# Patient Record
Sex: Male | Born: 1945 | Race: White | Hispanic: No | Marital: Married | Smoking: Never smoker
Health system: Southern US, Community
[De-identification: ages and names within clinical notes are randomized; demographics above are authoritative.]

## PROBLEM LIST (undated history)

## (undated) DIAGNOSIS — E785 Hyperlipidemia, unspecified: Secondary | ICD-10-CM

## (undated) DIAGNOSIS — K219 Gastro-esophageal reflux disease without esophagitis: Secondary | ICD-10-CM

## (undated) DIAGNOSIS — D126 Benign neoplasm of colon, unspecified: Secondary | ICD-10-CM

## (undated) DIAGNOSIS — I639 Cerebral infarction, unspecified: Secondary | ICD-10-CM

## (undated) DIAGNOSIS — H269 Unspecified cataract: Secondary | ICD-10-CM

## (undated) DIAGNOSIS — D649 Anemia, unspecified: Secondary | ICD-10-CM

## (undated) DIAGNOSIS — I82402 Acute embolism and thrombosis of unspecified deep veins of left lower extremity: Secondary | ICD-10-CM

## (undated) DIAGNOSIS — M4800 Spinal stenosis, site unspecified: Secondary | ICD-10-CM

## (undated) DIAGNOSIS — N189 Chronic kidney disease, unspecified: Secondary | ICD-10-CM

## (undated) DIAGNOSIS — I10 Essential (primary) hypertension: Secondary | ICD-10-CM

## (undated) DIAGNOSIS — D689 Coagulation defect, unspecified: Secondary | ICD-10-CM

## (undated) DIAGNOSIS — M1990 Unspecified osteoarthritis, unspecified site: Secondary | ICD-10-CM

## (undated) DIAGNOSIS — C439 Malignant melanoma of skin, unspecified: Secondary | ICD-10-CM

## (undated) DIAGNOSIS — M109 Gout, unspecified: Secondary | ICD-10-CM

## (undated) DIAGNOSIS — K259 Gastric ulcer, unspecified as acute or chronic, without hemorrhage or perforation: Secondary | ICD-10-CM

## (undated) DIAGNOSIS — T7840XA Allergy, unspecified, initial encounter: Secondary | ICD-10-CM

## (undated) DIAGNOSIS — G4730 Sleep apnea, unspecified: Secondary | ICD-10-CM

## (undated) HISTORY — DX: Coagulation defect, unspecified: D68.9

## (undated) HISTORY — DX: Essential (primary) hypertension: I10

## (undated) HISTORY — PX: POLYPECTOMY: SHX149

## (undated) HISTORY — DX: Unspecified cataract: H26.9

## (undated) HISTORY — DX: Hyperlipidemia, unspecified: E78.5

## (undated) HISTORY — PX: UPPER GASTROINTESTINAL ENDOSCOPY: SHX188

## (undated) HISTORY — DX: Chronic kidney disease, unspecified: N18.9

## (undated) HISTORY — DX: Allergy, unspecified, initial encounter: T78.40XA

## (undated) HISTORY — PX: COLONOSCOPY: SHX174

## (undated) HISTORY — DX: Malignant melanoma of skin, unspecified: C43.9

## (undated) HISTORY — DX: Cerebral infarction, unspecified: I63.9

## (undated) HISTORY — PX: SHOULDER ARTHROSCOPY W/ ROTATOR CUFF REPAIR: SHX2400

## (undated) HISTORY — DX: Gastro-esophageal reflux disease without esophagitis: K21.9

## (undated) HISTORY — DX: Sleep apnea, unspecified: G47.30

## (undated) HISTORY — DX: Benign neoplasm of colon, unspecified: D12.6

## (undated) HISTORY — DX: Acute embolism and thrombosis of unspecified deep veins of left lower extremity: I82.402

## (undated) HISTORY — DX: Unspecified osteoarthritis, unspecified site: M19.90

## (undated) HISTORY — DX: Spinal stenosis, site unspecified: M48.00

## (undated) HISTORY — DX: Gout, unspecified: M10.9

## (undated) HISTORY — DX: Gastric ulcer, unspecified as acute or chronic, without hemorrhage or perforation: K25.9

## (undated) HISTORY — DX: Anemia, unspecified: D64.9

---

## 2003-10-21 HISTORY — PX: BACK SURGERY: SHX140

## 2003-10-21 HISTORY — PX: TOTAL KNEE ARTHROPLASTY: SHX125

## 2004-07-20 ENCOUNTER — Encounter: Payer: Self-pay | Admitting: Orthopedic Surgery

## 2004-10-20 HISTORY — PX: TOTAL KNEE ARTHROPLASTY: SHX125

## 2005-08-12 ENCOUNTER — Ambulatory Visit: Payer: Self-pay | Admitting: Internal Medicine

## 2008-05-29 ENCOUNTER — Ambulatory Visit: Payer: Self-pay | Admitting: Internal Medicine

## 2008-06-07 ENCOUNTER — Ambulatory Visit: Payer: Self-pay | Admitting: Internal Medicine

## 2008-06-07 ENCOUNTER — Encounter: Payer: Self-pay | Admitting: Internal Medicine

## 2008-06-09 ENCOUNTER — Encounter: Payer: Self-pay | Admitting: Internal Medicine

## 2012-10-20 DIAGNOSIS — C439 Malignant melanoma of skin, unspecified: Secondary | ICD-10-CM

## 2012-10-20 HISTORY — PX: OTHER SURGICAL HISTORY: SHX169

## 2012-10-20 HISTORY — PX: GASTRIC BYPASS: SHX52

## 2012-10-20 HISTORY — DX: Malignant melanoma of skin, unspecified: C43.9

## 2013-02-15 DIAGNOSIS — I10 Essential (primary) hypertension: Secondary | ICD-10-CM | POA: Insufficient documentation

## 2013-02-15 DIAGNOSIS — M1990 Unspecified osteoarthritis, unspecified site: Secondary | ICD-10-CM | POA: Insufficient documentation

## 2013-02-15 DIAGNOSIS — E782 Mixed hyperlipidemia: Secondary | ICD-10-CM | POA: Insufficient documentation

## 2013-02-15 DIAGNOSIS — G4730 Sleep apnea, unspecified: Secondary | ICD-10-CM | POA: Insufficient documentation

## 2013-06-14 ENCOUNTER — Encounter: Payer: Self-pay | Admitting: Internal Medicine

## 2013-07-20 HISTORY — PX: ROUX-EN-Y GASTRIC BYPASS: SHX1104

## 2013-11-04 DIAGNOSIS — H02409 Unspecified ptosis of unspecified eyelid: Secondary | ICD-10-CM | POA: Insufficient documentation

## 2013-11-15 HISTORY — PX: OTHER SURGICAL HISTORY: SHX169

## 2014-02-17 DIAGNOSIS — Z85820 Personal history of malignant melanoma of skin: Secondary | ICD-10-CM | POA: Insufficient documentation

## 2014-02-27 HISTORY — PX: OTHER SURGICAL HISTORY: SHX169

## 2014-04-12 ENCOUNTER — Encounter: Payer: Self-pay | Admitting: Internal Medicine

## 2014-05-10 ENCOUNTER — Encounter: Payer: Self-pay | Admitting: Internal Medicine

## 2014-05-12 DIAGNOSIS — Z96659 Presence of unspecified artificial knee joint: Secondary | ICD-10-CM | POA: Insufficient documentation

## 2014-07-15 ENCOUNTER — Encounter: Payer: Self-pay | Admitting: Internal Medicine

## 2014-10-04 DIAGNOSIS — M1711 Unilateral primary osteoarthritis, right knee: Secondary | ICD-10-CM | POA: Insufficient documentation

## 2014-10-04 DIAGNOSIS — Z86718 Personal history of other venous thrombosis and embolism: Secondary | ICD-10-CM | POA: Insufficient documentation

## 2014-10-20 HISTORY — PX: TOTAL KNEE ARTHROPLASTY: SHX125

## 2014-10-31 HISTORY — PX: TOTAL KNEE ARTHROPLASTY: SHX125

## 2014-11-01 DIAGNOSIS — R001 Bradycardia, unspecified: Secondary | ICD-10-CM | POA: Insufficient documentation

## 2015-06-05 ENCOUNTER — Encounter: Payer: Self-pay | Admitting: Internal Medicine

## 2015-06-08 ENCOUNTER — Telehealth: Payer: Self-pay | Admitting: *Deleted

## 2015-06-08 ENCOUNTER — Ambulatory Visit (AMBULATORY_SURGERY_CENTER): Payer: Self-pay | Admitting: *Deleted

## 2015-06-08 VITALS — Ht 69.0 in | Wt 214.8 lb

## 2015-06-08 DIAGNOSIS — Z86010 Personal history of colonic polyps: Secondary | ICD-10-CM

## 2015-06-08 MED ORDER — NA SULFATE-K SULFATE-MG SULF 17.5-3.13-1.6 GM/177ML PO SOLN: ORAL | Status: DC

## 2015-06-08 NOTE — Telephone Encounter (Signed)
Dr Henrene Pastor:  Pt is scheduled for recall colonoscopy for hx colon polyps on 06/19/15.  Recall was due 2014.  Pt had bariatric surgery with Dr. Hampton Abbot  2014 for obesity and had EGD at that time showing:   A. GASTRIC ANTRUM, ENDOSCOPIC BIOPSY:  GASTRIC ANTRAL MUCOSA WITH MILD ACUTE INFLAMMATION, WITH FEATURES SUGGESTIVE OF EROSIVE GASTRITIS. NO HELICOBACTER PYLORI ORGANISMS ARE SEEN ON ROUTINE H&E STAIN. NO DYSPLASIA OR CARCINOMA IS SEEN.   B. GE JUNCTION, ENDOSCOPIC BIOPSY:  MUCOSA OF THE SQUAMOCOLUMNAR JUNCTION. GOBLET CELL INTESTINAL METAPLASIA IS SEEN IN GASTRIC TYPE MUCOSA, CONSISTENT WITH BARRETT'S ESOPHAGUS. NO DYSPLASIA IS SEEN.   ( Path report is under Care Everywhere)  Pt is scheduled to see Dr. Hampton Abbot in October of this year for follow up from bariatric surgery.  Is it ok for him to continue with scheduled colonoscopy and discuss need for EGD then or does he need OV with you?  Please advise.  Thanks, Juliann Pulse

## 2015-06-08 NOTE — Progress Notes (Signed)
No allergies to eggs or soy. No problems with anesthesia.  Pt given Emmi instructions for colonoscopy  No oxygen use  No diet drug use  

## 2015-06-08 NOTE — Telephone Encounter (Signed)
Note added to schedule as reminder to discuss with pt day of colonoscopy

## 2015-06-08 NOTE — Telephone Encounter (Signed)
You can schedule colonoscopy and we can discuss at that time. He will need to remind me, however. Thanks

## 2015-06-19 ENCOUNTER — Ambulatory Visit (AMBULATORY_SURGERY_CENTER): Payer: Medicare Other | Admitting: Internal Medicine

## 2015-06-19 ENCOUNTER — Encounter: Payer: Self-pay | Admitting: Internal Medicine

## 2015-06-19 VITALS — BP 126/64 | HR 55 | Temp 98.4°F | Resp 20 | Ht 69.0 in | Wt 214.0 lb

## 2015-06-19 DIAGNOSIS — Z86010 Personal history of colonic polyps: Secondary | ICD-10-CM

## 2015-06-19 DIAGNOSIS — D123 Benign neoplasm of transverse colon: Secondary | ICD-10-CM

## 2015-06-19 DIAGNOSIS — D122 Benign neoplasm of ascending colon: Secondary | ICD-10-CM

## 2015-06-19 MED ORDER — SODIUM CHLORIDE 0.9 % IV SOLN: 500.0000 mL | INTRAVENOUS | Status: DC

## 2015-06-19 NOTE — Patient Instructions (Signed)
YOU HAD AN ENDOSCOPIC PROCEDURE TODAY AT [facility]:   Refer to the procedure report that was given to you for any specific questions about what was found during the examination.  If the procedure report does not answer your questions, please call your gastroenterologist to clarify.  If you requested that your care partner not be given the details of your procedure findings, then the procedure report has been included in a sealed envelope for you to review at your convenience later.  YOU SHOULD EXPECT: Some feelings of bloating in the abdomen. Passage of more gas than usual.  Walking can help get rid of the air that was put into your GI tract during the procedure and reduce the bloating. If you had a lower endoscopy (such as a colonoscopy or flexible sigmoidoscopy) you may notice spotting of blood in your stool or on the toilet paper. If you underwent a bowel prep for your procedure, you may not have a normal bowel movement for a few days.  Please Note:  You might notice some irritation and congestion in your nose or some drainage.  This is from the oxygen used during your procedure.  There is no need for concern and it should clear up in a day or so.  SYMPTOMS TO REPORT IMMEDIATELY:   Following lower endoscopy (colonoscopy or flexible sigmoidoscopy):  Excessive amounts of blood in the stool  Significant tenderness or worsening of abdominal pains  Swelling of the abdomen that is new, acute  Fever of 100F or higher   For urgent or emergent issues, a gastroenterologist can be reached at any hour by calling [phone].   DIET: Your first meal following the procedure should be a small meal and then it is ok to progress to your normal diet. Heavy or fried foods are harder to digest and may make you feel nauseous or bloated.  Likewise, meals heavy in dairy and vegetables can increase bloating.  Drink plenty of fluids but you should avoid alcoholic beverages for 24 hours. Try to  increase the fiber in your diet.  ACTIVITY:  You should plan to take it easy for the rest of today and you should NOT DRIVE or use heavy machinery until tomorrow (because of the sedation medicines used during the test).    FOLLOW UP: Our staff will call the number listed on your records the next business day following your procedure to check on you and address any questions or concerns that you may have regarding the information given to you following your procedure. If we do not reach you, we will leave a message.  However, if you are feeling well and you are not experiencing any problems, there is no need to return our call.  We will assume that you have returned to your regular daily activities without incident.  If any biopsies were taken you will be contacted by phone or by letter within the next 1-3 weeks.  Please call us at [phone] if you have not heard about the biopsies in 3 weeks.    SIGNATURES/CONFIDENTIALITY: You and/or your care partner have signed paperwork which will be entered into your electronic medical record.  These signatures attest to the fact that that the information above on your After Visit Summary has been reviewed and is understood.  Full responsibility of the confidentiality of this discharge information lies with you and/or your care-partner.

## 2015-06-19 NOTE — Op Note (Signed)
[facility] [place]  [address] [place], [address]   COLONOSCOPY PROCEDURE REPORT  PATIENT: Randall [name], Randall [name]  MR#: [id number] BIRTHDATE: 22-Jun-1946 , 69  yrs. old GENDER: male ENDOSCOPIST: Eustace Quail, MD REFERRED [id number] Program Recall PROCEDURE DATE:  06/19/2015 PROCEDURE:   Colonoscopy, surveillance and Colonoscopy with snare polypectomy X2 First Screening Colonoscopy - Avg.  risk and is 50 yrs.  old or older - No.  Prior Negative Screening - Now for repeat screening. N/A  History of Adenoma - Now for follow-up colonoscopy & has been > or = to 3 yrs.  Yes hx of adenoma.  Has been 3 or more years since last colonoscopy.  Polyps removed today? Yes ASA CLASS:   Class II INDICATIONS:Surveillance due to prior colonic neoplasia and PH Colon Adenoma.  . Index exam 2004 with hyperplastic polyps; last examination 2009 with sessile serrated adenoma MEDICATIONS: Monitored anesthesia care and Propofol 200 mg IV  DESCRIPTION OF PROCEDURE:   After the risks benefits and alternatives of the procedure were thoroughly explained, informed consent was obtained.  The digital rectal exam revealed no abnormalities of the rectum.   The LB [id number]  endoscope was introduced through the anus and advanced to the cecum, which was identified by both the appendix and ileocecal valve. No adverse events experienced.   The quality of the prep was excellent. (Suprep was used)  The instrument was then slowly withdrawn as the colon was fully examined. Estimated blood loss is zero unless otherwise noted in this procedure report.  COLON FINDINGS: Two polyps measuring 3 mm in size were found in the ascending colon and transverse colon.  A polypectomy was performed with a cold snare.  The resection was complete, the polyp tissue was completely retrieved and sent to histology.   There was moderate diverticulosis noted in the left colon.   The examination was otherwise normal.   Retroflexed views revealed internal hemorrhoids. The time to cecum = 1.9 Withdrawal time = 10.0   The scope was withdrawn and the procedure completed. COMPLICATIONS: There were no immediate complications.  ENDOSCOPIC IMPRESSION: 1.   Two polyps were found in the ascending colon and transverse colon; polypectomy was performed with a cold snare 2.   Moderate diverticulosis was noted in the left colon 3.   The examination was otherwise normal  RECOMMENDATIONS: 1.  Repeat colonoscopy in 5 years if polyp adenomatous; otherwise 10 years 2.  Upper endoscopy will be scheduled  in [place] at patient's nearest convenience "history of Barrett's esophagus on EGD 2014 elsewhere"  eSigned:  Eustace Quail, MD 06/19/2015 2:15 PM   cc: The Patient and Ramonita Lab, III MD

## 2015-06-19 NOTE — Progress Notes (Signed)
Called to room to assist during endoscopic procedure.  Patient ID and intended procedure confirmed with present staff. Received instructions for my participation in the procedure from the performing physician.  

## 2015-06-19 NOTE — Progress Notes (Signed)
Report to PACU, RN, vss, BBS= Clear.  

## 2015-06-20 ENCOUNTER — Telehealth: Payer: Self-pay | Admitting: *Deleted

## 2015-06-20 NOTE — Telephone Encounter (Signed)
  Follow up Call-  Call back number 06/19/2015  Post procedure Call Back phone  # [phone]  Permission to leave phone message Yes     Patient questions:  Do you have a fever, pain , or abdominal swelling? No. Pain Score  0 *  Have you tolerated food without any problems? Yes.    Have you been able to return to your normal activities? Yes.    Do you have any questions about your discharge instructions: Diet   No. Medications  No. Follow up visit  No.  Do you have questions or concerns about your Care? No.  Actions: * If pain score is 4 or above: No action needed, pain <4.

## 2015-06-27 ENCOUNTER — Encounter: Payer: Self-pay | Admitting: Internal Medicine

## 2015-08-09 ENCOUNTER — Ambulatory Visit (AMBULATORY_SURGERY_CENTER): Payer: Self-pay | Admitting: *Deleted

## 2015-08-09 VITALS — Ht 69.5 in | Wt 225.0 lb

## 2015-08-09 DIAGNOSIS — K2270 Barrett's esophagus without dysplasia: Secondary | ICD-10-CM

## 2015-08-09 NOTE — Progress Notes (Addendum)
No egg or soy allergy No issues with past sedation No diet pills No home 02 use  Pt wants to know if his pouch is the correct size since he had the gastric bypass surgery  [place] to take 2 rounds of cipro. Then got c diff. He is on his 2nd round of abx for c diff and states he has 4-5 days of abx left to complete this.

## 2015-08-24 ENCOUNTER — Ambulatory Visit (AMBULATORY_SURGERY_CENTER): Payer: Medicare Other | Admitting: Internal Medicine

## 2015-08-24 ENCOUNTER — Encounter: Payer: Self-pay | Admitting: Internal Medicine

## 2015-08-24 VITALS — BP 123/83 | HR 53 | Temp 97.9°F | Resp 16 | Ht 69.5 in | Wt 225.0 lb

## 2015-08-24 DIAGNOSIS — K219 Gastro-esophageal reflux disease without esophagitis: Secondary | ICD-10-CM

## 2015-08-24 DIAGNOSIS — K222 Esophageal obstruction: Secondary | ICD-10-CM

## 2015-08-24 DIAGNOSIS — K2270 Barrett's esophagus without dysplasia: Secondary | ICD-10-CM

## 2015-08-24 MED ORDER — SODIUM CHLORIDE 0.9 % IV SOLN: 500.0000 mL | INTRAVENOUS | Status: DC

## 2015-08-24 NOTE — Progress Notes (Signed)
Report to PACU, RN, vss, BBS= Clear.  

## 2015-08-24 NOTE — Progress Notes (Signed)
Dr. Henrene Pastor in to see pt.  Checked left arm. Advised to place warm compresses on arm off and on today. Advised to call prn.

## 2015-08-24 NOTE — Progress Notes (Signed)
IV site left inner forearm tender, bruised, with small area firmness at site.  Will advise Dr. Henrene Pastor. Warm compress applied.

## 2015-08-24 NOTE — Patient Instructions (Signed)
YOU HAD AN ENDOSCOPIC PROCEDURE TODAY AT THE [facility]:   Refer to the procedure report that was given to you for any specific questions about what was found during the examination.  If the procedure report does not answer your questions, please call your gastroenterologist to clarify.  If you requested that your care partner not be given the details of your procedure findings, then the procedure report has been included in a sealed envelope for you to review at your convenience later.  YOU SHOULD EXPECT: Some feelings of bloating in the abdomen. Passage of more gas than usual.  Walking can help get rid of the air that was put into your GI tract during the procedure and reduce the bloating. If you had a lower endoscopy (such as a colonoscopy or flexible sigmoidoscopy) you may notice spotting of blood in your stool or on the toilet paper. If you underwent a bowel prep for your procedure, you may not have a normal bowel movement for a few days.  Please Note:  You might notice some irritation and congestion in your nose or some drainage.  This is from the oxygen used during your procedure.  There is no need for concern and it should clear up in a day or so.  SYMPTOMS TO REPORT IMMEDIATELY:    Following upper endoscopy (EGD)  Vomiting of blood or coffee ground material  New chest pain or pain under the shoulder blades  Painful or persistently difficult swallowing  New shortness of breath  Fever of 100F or higher  Black, tarry-looking stools  For urgent or emergent issues, a gastroenterologist can be reached at any hour by calling [phone].   DIET: Your first meal following the procedure should be a small meal and then it is ok to progress to your normal diet. Heavy or fried foods are harder to digest and may make you feel nauseous or bloated.  Likewise, meals heavy in dairy and vegetables can increase bloating.  Drink plenty of fluids but you should avoid alcoholic beverages  for 24 hours.  ACTIVITY:  You should plan to take it easy for the rest of today and you should NOT DRIVE or use heavy machinery until tomorrow (because of the sedation medicines used during the test).    FOLLOW UP: Our staff will call the number listed on your records the next business day following your procedure to check on you and address any questions or concerns that you may have regarding the information given to you following your procedure. If we do not reach you, we will leave a message.  However, if you are feeling well and you are not experiencing any problems, there is no need to return our call.  We will assume that you have returned to your regular daily activities without incident.  If any biopsies were taken you will be contacted by phone or by letter within the next 1-3 weeks.  Please call us at [phone] if you have not heard about the biopsies in 3 weeks.    SIGNATURES/CONFIDENTIALITY: You and/or your care partner have signed paperwork which will be entered into your electronic medical record.  These signatures attest to the fact that that the information above on your After Visit Summary has been reviewed and is understood.  Full responsibility of the confidentiality of this discharge information lies with you and/or your care-partner. 

## 2015-08-27 ENCOUNTER — Telehealth: Payer: Self-pay | Admitting: *Deleted

## 2015-08-27 NOTE — Telephone Encounter (Signed)
  Follow up Call-  Call back number 08/24/2015 06/19/2015  Post procedure Call Back phone  # [phone]  Permission to leave phone message Yes Yes     Patient questions:  Do you have a fever, pain , or abdominal swelling? No. Pain Score  0 *  Have you tolerated food without any problems? Yes.    Have you been able to return to your normal activities? Yes.    Do you have any questions about your discharge instructions: Diet   No. Medications  No. Follow up visit  No.  Do you have questions or concerns about your Care? No.  Actions: * If pain score is 4 or above: No action needed, pain <4.  Patient stating he has bruising from IV site. Denies swelling, or drainage. Patient stating he does have full use of fingers and hand. Patient able to move arm freely. Patient denies fever. Patient to call with any concerns or if any symptoms occur as listed above. Patient verbalized understanding and agreement. Patient will continue warm compresses to the site.

## 2015-08-27 NOTE — Op Note (Addendum)
[place]  [address] [place], [address]   ENDOSCOPY PROCEDURE REPORT  PATIENT: Randall [name], Randall [name]  MR#: [id number] BIRTHDATE: 1946/03/29 , 69  yrs. old GENDER: male ENDOSCOPIST: Eustace Quail, MD REFERRED BY:  Ramonita Lab, III, M.D. PROCEDURE DATE:  08/24/2015 PROCEDURE:  EGD, diagnostic ASA CLASS:     Class II INDICATIONS:  history of Barrett's esophagus.. On EGD elsewhere 2014  MEDICATIONS: Monitored anesthesia care and Propofol 100 mg IV TOPICAL ANESTHETIC: none  DESCRIPTION OF PROCEDURE: After the risks benefits and alternatives of the procedure were thoroughly explained, informed consent was obtained.  The LB [id number] endoscope was introduced through the mouth and advanced to the second portion of the duodenum , Without limitations.  The instrument was slowly withdrawn as the mucosa was fully examined.   EXAM:The esophagus revealed normal point of transition between the squamous mucosa of the esophagus and columnar mucosa of the proximal gastric folds.  No Barrett's esophagus by definition. There was a partial ring at the gastroesophageal junction.  The stomach revealed evidence of prior gastric bypass surgery.  The anastomosis was widely patent and the small bowel normal. Retroflexed views revealed no abnormalities.     The scope was then withdrawn from the patient and the procedure completed.  COMPLICATIONS: There were no immediate complications.  ENDOSCOPIC IMPRESSION: 1. No Barrett's esophagus 2. Partial distal esophageal ring 3. Post gastric bypass anatomy 4. GERD by history  RECOMMENDATIONS: 1. Continue current medications 2. This patient does not need surveillance upper endoscopy in the future  REPEAT EXAM:  eSigned:  Eustace Quail, MD 08/24/2015 1:37 PM    CC:The Patient and Ramonita Lab, III MD

## 2016-03-24 ENCOUNTER — Encounter: Payer: Self-pay | Admitting: Emergency Medicine

## 2016-03-24 ENCOUNTER — Emergency Department: Payer: Medicare Other

## 2016-03-24 ENCOUNTER — Emergency Department
Admission: EM | Admit: 2016-03-24 | Discharge: 2016-03-24 | Payer: Medicare Other | Attending: Emergency Medicine | Admitting: Emergency Medicine

## 2016-03-24 DIAGNOSIS — R0781 Pleurodynia: Secondary | ICD-10-CM | POA: Insufficient documentation

## 2016-03-24 DIAGNOSIS — Z85820 Personal history of malignant melanoma of skin: Secondary | ICD-10-CM | POA: Insufficient documentation

## 2016-03-24 DIAGNOSIS — Z792 Long term (current) use of antibiotics: Secondary | ICD-10-CM | POA: Diagnosis not present

## 2016-03-24 DIAGNOSIS — Z79899 Other long term (current) drug therapy: Secondary | ICD-10-CM | POA: Diagnosis not present

## 2016-03-24 DIAGNOSIS — F17290 Nicotine dependence, other tobacco product, uncomplicated: Secondary | ICD-10-CM | POA: Diagnosis not present

## 2016-03-24 DIAGNOSIS — R1030 Lower abdominal pain, unspecified: Secondary | ICD-10-CM | POA: Diagnosis present

## 2016-03-24 DIAGNOSIS — K631 Perforation of intestine (nontraumatic): Secondary | ICD-10-CM

## 2016-03-24 DIAGNOSIS — M1990 Unspecified osteoarthritis, unspecified site: Secondary | ICD-10-CM | POA: Insufficient documentation

## 2016-03-24 DIAGNOSIS — I10 Essential (primary) hypertension: Secondary | ICD-10-CM | POA: Diagnosis not present

## 2016-03-24 LAB — COMPREHENSIVE METABOLIC PANEL
ALT: 23 U/L (ref 17–63)
AST: 35 U/L (ref 15–41)
Albumin: 4.2 g/dL (ref 3.5–5.0)
Alkaline Phosphatase: 106 U/L (ref 38–126)
Anion gap: 9 (ref 5–15)
BILIRUBIN TOTAL: 1.2 mg/dL (ref 0.3–1.2)
BUN: 23 mg/dL — ABNORMAL HIGH (ref 6–20)
CHLORIDE: 102 mmol/L (ref 101–111)
CO2: 24 mmol/L (ref 22–32)
CREATININE: 0.93 mg/dL (ref 0.61–1.24)
Calcium: 9.2 mg/dL (ref 8.9–10.3)
Glucose, Bld: 148 mg/dL — ABNORMAL HIGH (ref 65–99)
POTASSIUM: 3.8 mmol/L (ref 3.5–5.1)
Sodium: 135 mmol/L (ref 135–145)
TOTAL PROTEIN: 7.3 g/dL (ref 6.5–8.1)

## 2016-03-24 LAB — TYPE AND SCREEN
ABO/RH(D): A POS
ANTIBODY SCREEN: NEGATIVE

## 2016-03-24 LAB — CBC
HEMATOCRIT: 39.2 % — AB (ref 40.0–52.0)
Hemoglobin: 13.2 g/dL (ref 13.0–18.0)
MCH: 28.8 pg (ref 26.0–34.0)
MCHC: 33.6 g/dL (ref 32.0–36.0)
MCV: 85.9 fL (ref 80.0–100.0)
PLATELETS: 211 10*3/uL (ref 150–440)
RBC: 4.56 MIL/uL (ref 4.40–5.90)
RDW: 13.8 % (ref 11.5–14.5)
WBC: 11.4 10*3/uL — AB (ref 3.8–10.6)

## 2016-03-24 LAB — LIPASE, BLOOD: LIPASE: 20 U/L (ref 11–51)

## 2016-03-24 MED ORDER — SODIUM CHLORIDE 0.9 % IV SOLN
Freq: Once | INTRAVENOUS | Status: AC
  Administered 2016-03-24: 18:00:00 via INTRAVENOUS

## 2016-03-24 MED ORDER — IOPAMIDOL (ISOVUE-370) INJECTION 76%
100.0000 mL | Freq: Once | INTRAVENOUS | Status: AC | PRN
  Administered 2016-03-24: 100 mL via INTRAVENOUS

## 2016-03-24 MED ORDER — MORPHINE SULFATE (PF) 4 MG/ML IV SOLN
4.0000 mg | Freq: Once | INTRAVENOUS | Status: AC
  Administered 2016-03-24: 4 mg via INTRAVENOUS
  Filled 2016-03-24: qty 1

## 2016-03-24 MED ORDER — VANCOMYCIN HCL IN DEXTROSE 1-5 GM/200ML-% IV SOLN
1000.0000 mg | Freq: Once | INTRAVENOUS | Status: AC
  Administered 2016-03-24: 1000 mg via INTRAVENOUS
  Filled 2016-03-24: qty 200

## 2016-03-24 MED ORDER — PIPERACILLIN-TAZOBACTAM 3.375 G IVPB
3.3750 g | Freq: Once | INTRAVENOUS | Status: DC
  Administered 2016-03-24: 3.375 g via INTRAVENOUS
  Filled 2016-03-24: qty 50

## 2016-03-24 MED ORDER — SODIUM CHLORIDE 0.9 % IV SOLN
Freq: Once | INTRAVENOUS | Status: AC
  Administered 2016-03-24: 19:00:00 via INTRAVENOUS

## 2016-03-24 MED ORDER — MORPHINE SULFATE (PF) 4 MG/ML IV SOLN
INTRAVENOUS | Status: AC
  Administered 2016-03-24: 4 mg via INTRAVENOUS
  Filled 2016-03-24: qty 1

## 2016-03-24 MED ORDER — MORPHINE SULFATE (PF) 4 MG/ML IV SOLN
4.0000 mg | Freq: Once | INTRAVENOUS | Status: AC
  Administered 2016-03-24: 4 mg via INTRAVENOUS

## 2016-03-24 NOTE — ED Notes (Signed)
Lower abdominal pain began this am, denies dysuria.

## 2016-03-24 NOTE — ED Provider Notes (Signed)
[facility] Emergency Department Provider Note        Time seen: ----------------------------------------- 5:38 PM on 03/24/2016 -----------------------------------------    I have reviewed the triage vital signs and the nursing notes.   HISTORY  Chief Complaint Abdominal Pain    HPI Randall [name] is a 70 y.o. male who presents to the ER for severe abdominal pain that began this morning. He denies any history of same, nothing makes it better or worse.Patient denies fevers, but has chest pain when he breathes, denies nausea, vomiting or diarrhea. Patient denies constipation. Patient states been moving his bowels okay, he's never had pain like this before.   Past Medical History  Diagnosis Date  . Gout   . Hypertension   . GERD (gastroesophageal reflux disease)   . Arthritis   . 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 Sleep apnea     cpap    There are no active problems to display for this patient.   Past Surgical History  Procedure Laterality Date  . Removal melanoma  2014    forehead  . Total knee arthroplasty Left 2005  . Total knee arthroplasty Right 2016  . Gastric bypass  2014  . Back surgery  2005    spinal stenosis  . Barett's esophagus  2014  . Shoulder arthroscopy w/ rotator cuff repair Right     x2  . Colonoscopy    . Upper gastrointestinal endoscopy    . Polypectomy      Allergies Amlodipine and Nsaids  Social History Social History  Substance Use Topics  . Smoking status: Never Smoker   . Smokeless tobacco: Current User    Types: Snuff  . Alcohol Use: 0.0 oz/week    0 Standard drinks or equivalent per week     Comment: 1 glass wine a month    Review of Systems Constitutional: Negative for fever. Eyes: Negative for visual changes. ENT: Negative for sore throat. Cardiovascular: Positive for pleuritic chest pain Respiratory: Negative for shortness of breath. Gastrointestinal: Positive for abdominal  pain Genitourinary: Negative for dysuria. Musculoskeletal: Negative for back pain. Skin: Negative for rash. Neurological: Negative for headaches, positive for weakness  10-point ROS otherwise negative.  ____________________________________________   PHYSICAL EXAM:  VITAL SIGNS: ED Triage Vitals  Enc Vitals Group     BP 03/24/16 1655 114/61 mmHg     Pulse Rate 03/24/16 1655 74     Resp 03/24/16 1655 20     Temp 03/24/16 1655 98.3 F (36.8 C)     Temp Source 03/24/16 1655 Oral     SpO2 03/24/16 1655 96 %     Weight 03/24/16 1655 229 lb (103.874 kg)     Height 03/24/16 1655 5\' 9"  (1.753 m)     Head Cir --      Peak Flow --      Pain Score 03/24/16 1658 10     Pain Loc --      Pain Edu? --      Excl. in [place]? --    Constitutional: Alert and oriented. Moderate distress Eyes: Conjunctivae are normal. PERRL. Normal extraocular movements. ENT   Head: Normocephalic and atraumatic.   Nose: No congestion/rhinnorhea.   Mouth/Throat: Mucous membranes are moist.   Neck: No stridor. Cardiovascular: Normal rate, regular rhythm. No murmurs, rubs, or gallops. Respiratory: Tachypnea with clear breath sounds Gastrointestinal: Severe diffuse abdominal tenderness, rebound and guarding is present. Hypoactive bowel sounds. Musculoskeletal: Nontender with normal range of motion  in all extremities. No lower extremity tenderness nor edema. Neurologic:  Normal speech and language. No gross focal neurologic deficits are appreciated.  Skin:  Darkened discoloration or purple discoloration to the anterior abdominal wall Psychiatric: Mood and affect are normal. Speech and behavior are normal.  ____________________________________________  EKG: Interpreted by me. Sinus rhythm with a rate of 57 bpm, normal PR interval, normal QRS, normal QT interval. Normal axis.  ____________________________________________  ED COURSE:  Pertinent labs & imaging results that were available during my care  of the patient were reviewed by me and considered in my medical decision making (see chart for details). Patient resents to ER with severe diffuse abdominal pain, patient appears significantly ill and in distress. Concern would be for dissection or AAA. Patient will be taken emergently to CT scanner. ____________________________________________    LABS (pertinent positives/negatives)  Labs Reviewed  COMPREHENSIVE METABOLIC PANEL - Abnormal; Notable for the following:    Glucose, Bld 148 (*)    BUN 23 (*)    All other components within normal limits  CBC - Abnormal; Notable for the following:    WBC 11.4 (*)    HCT 39.2 (*)    All other components within normal limits  LIPASE, BLOOD  URINALYSIS COMPLETEWITH MICROSCOPIC ([facility] ONLY)  LACTIC ACID, PLASMA  TYPE AND SCREEN   CRITICAL CARE Performed by: Earleen Newport   Total critical care time: 30 minutes  Critical care time was exclusive of separately billable procedures and treating other patients.  Critical care was necessary to treat or prevent imminent or life-threatening deterioration.  Critical care was time spent personally by me on the following activities: development of treatment plan with patient and/or surrogate as well as nursing, discussions with consultants, evaluation of patient's response to treatment, examination of patient, obtaining history from patient or surrogate, ordering and performing treatments and interventions, ordering and review of laboratory studies, ordering and review of radiographic studies, pulse oximetry and re-evaluation of patient's condition.  RADIOLOGY Images were viewed by me  CT of the chest abdomen and pelvis with contrast Skin reviewed by me, patient with bowel perforation and intra-abdominal fluid. I discussed with the radiologist, this is possibly felt to be from breakdown of the gastric bypass 4 from a perforated ulcer. ____________________________________________  FINAL  ASSESSMENT AND PLAN  Abdominal pain, Bowel perforation  Plan: Patient with labs and imaging as dictated above. Patient was CT scan findings as dictated above. Patient appears ill, have ordered back mice and Zosyn. I will discuss with the [facility] transfer center for transfer to the bariatric surgery service.   Earleen Newport, MD Patient's been accepted in transfer to [facility]. I discussed the case with Dr. Hampton Abbot. He remains stable but is in critical condition. We will send him emergency traffic to [facility]. He has been given saline boluses as well as broad-spectrum antibiotic.  Note: This dictation was prepared with Dragon dictation. Any transcriptional errors that result from this process are unintentional   Earleen Newport, MD 03/24/16 [id number]

## 2016-04-14 DIAGNOSIS — Z87898 Personal history of other specified conditions: Secondary | ICD-10-CM | POA: Insufficient documentation

## 2016-10-30 IMAGING — CT CT ANGIO CHEST-ABD-PELV FOR DISSECTION W/ AND WO/W CM
2 of 6 series · 12 of 36 positions shown, 17 images · IV contrast (APPLIED)
Comparison: None.

CLINICAL DATA: Abdominal pain and pain with inspiration for several
hours, initial encounter

EXAM:
CT ANGIOGRAPHY CHEST, ABDOMEN AND PELVIS
TECHNIQUE: Multidetector CT imaging through the chest, abdomen and pelvis was
performed using the standard protocol during bolus administration of
intravenous contrast. Multiplanar reconstructed images and MIPs were
obtained and reviewed to evaluate the vascular anatomy.
CONTRAST:  100 mL Isovue 370.

[Series 6: arterial · axial · arterial · 0.82mm/px · z∈[-390,+146]mm · 11 of 309 slices shown, 15 images]
[im 27/309  mediastinal]
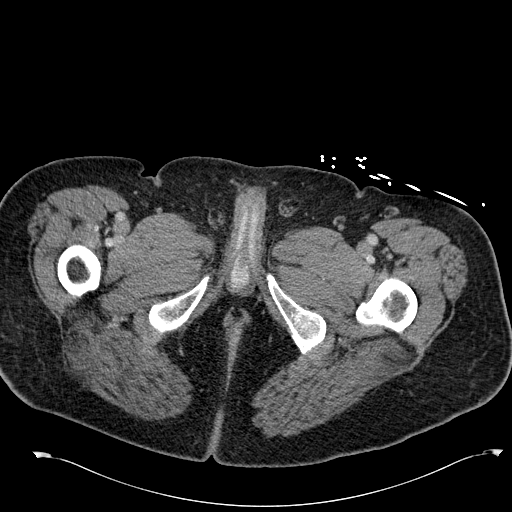
[im 27/309  bone]
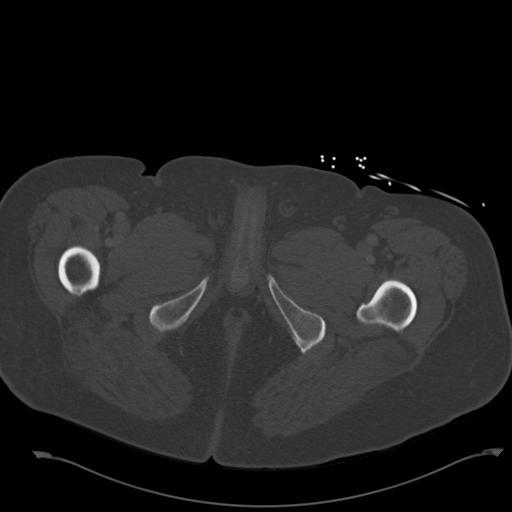
[im 54/309  mediastinal]
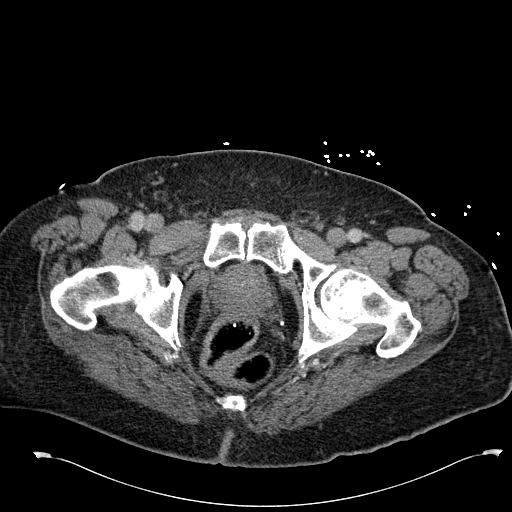
[im 94/309  mediastinal]
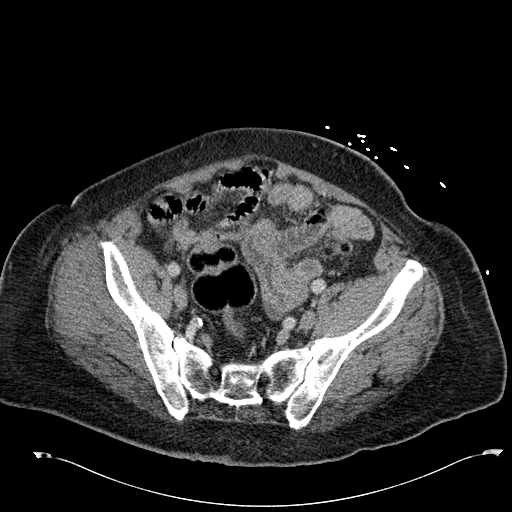
[im 121/309  mediastinal]
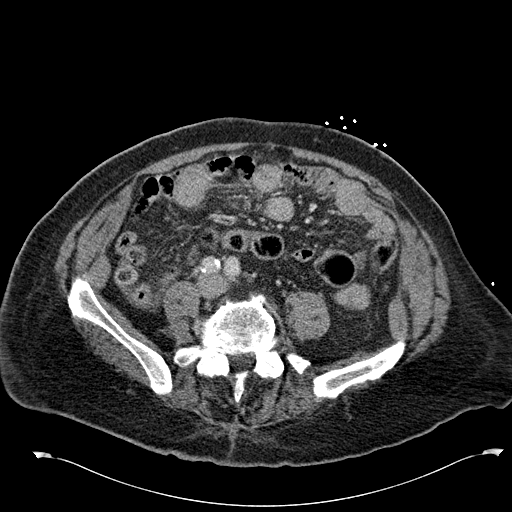
[im 161/309  mediastinal]
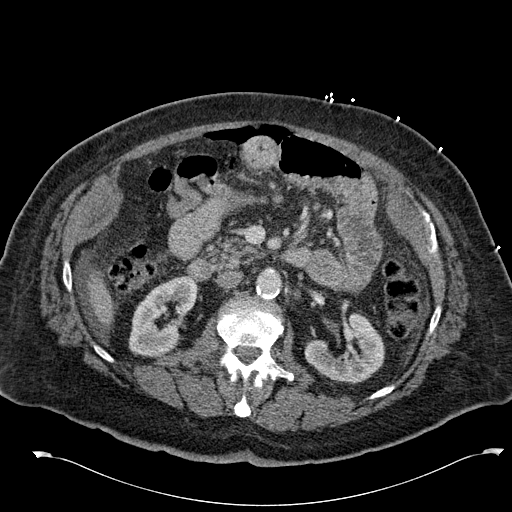
[im 188/309  mediastinal]
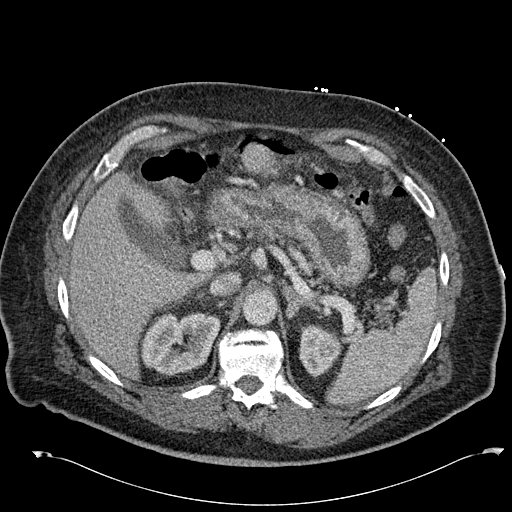
[im 215/309  mediastinal]
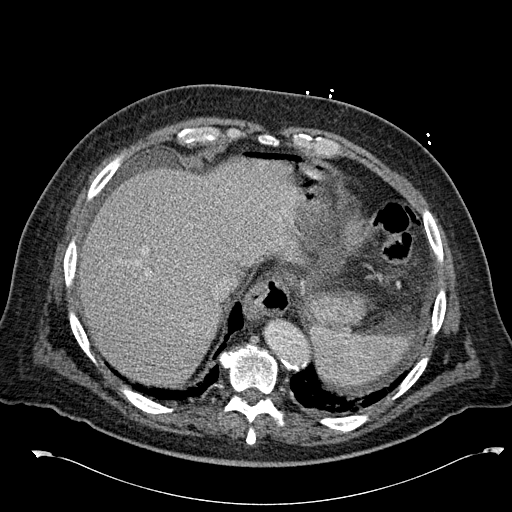
[im 255/309  mediastinal]
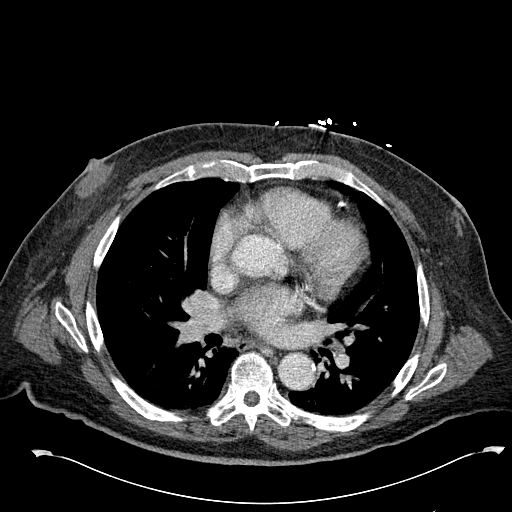
[im 255/309  lung]
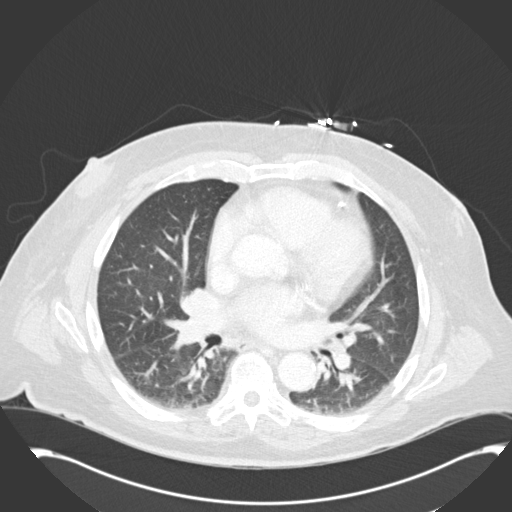
[im 268/309  lung]
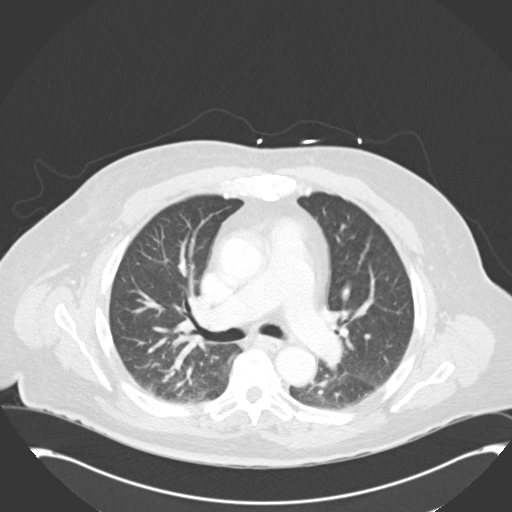
[im 282/309  mediastinal]
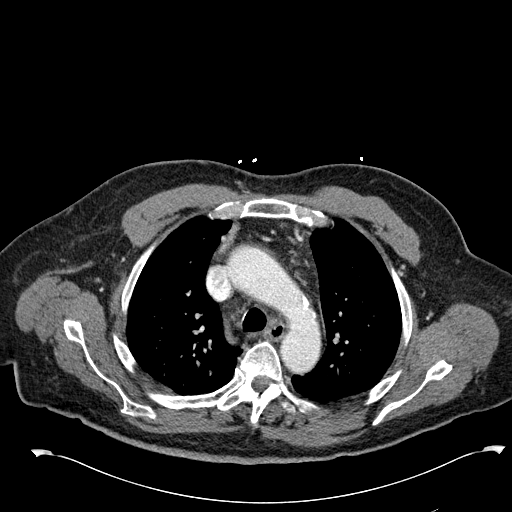
[im 282/309  lung]
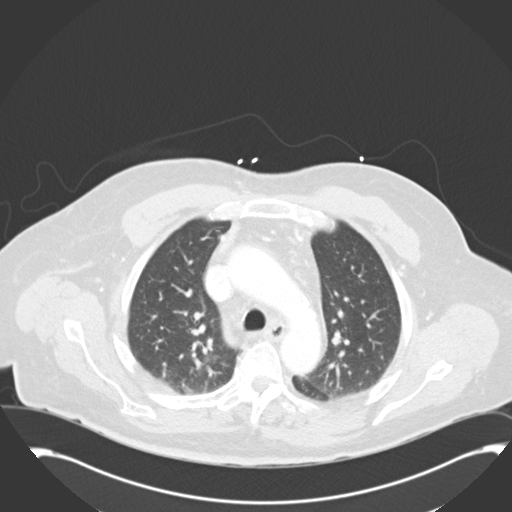
[im 282/309  bone]
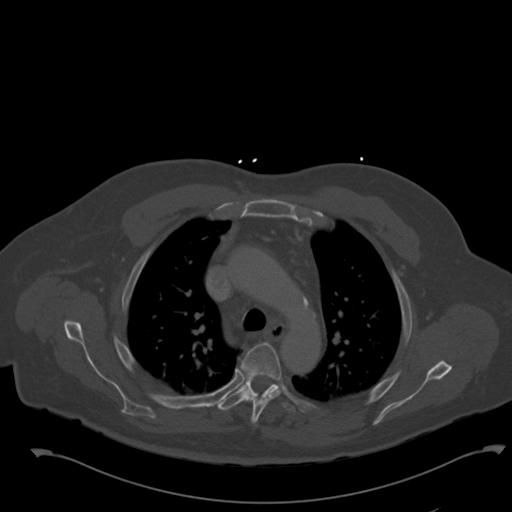
[im 295/309  lung]
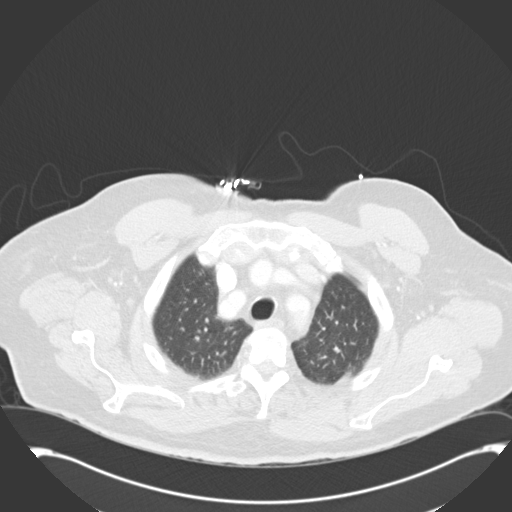

[Series 8: cor arterial mpr · coronal · arterial · 0.82mm/px · 1 of 155 slices shown, 2 images]
[im 78/155  mediastinal]
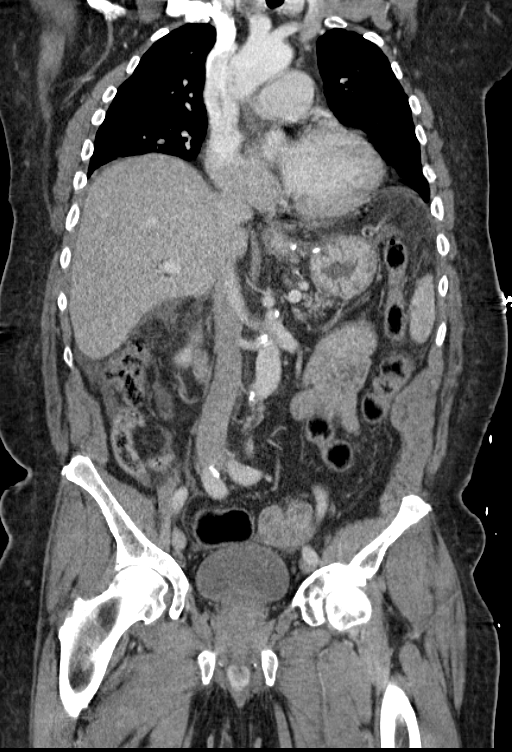
[im 78/155  bone]
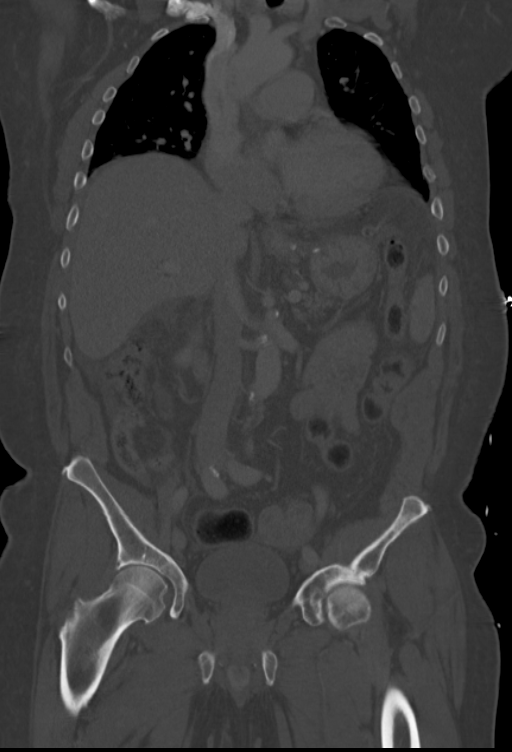

[12 of 36 positions shown; findings below may reference images not displayed]

FINDINGS: CTA CHEST FINDINGS

The lungs are well aerated bilaterally with mild dependent
atelectatic changes bilaterally. No sizable effusion or focal
confluent infiltrate is seen.

The thoracic inlet demonstrates evidence of a hypodense thyroid
nodule measuring 3 cm in greatest dimension. The thoracic aorta and
its branches show normal appearance without evidence of dissection
or aneurysmal dilatation. Mild atherosclerotic calcifications are
seen.

The pulmonary artery although incompletely evaluated on this exam
shows no large central pulmonary embolism. No hilar or mediastinal
adenopathy is seen. Mild coronary calcifications are noted.

The osseous structures show postsurgical changes in the right
shoulder. No compression deformities are noted. Mild degenerative
changes of the thoracic spine are seen.

Review of the MIP images confirms the above findings.

CTA ABDOMEN AND PELVIS FINDINGS

The liver, gallbladder, spleen, adrenal glands and pancreas are
within normal limits. The kidneys are well visualized bilaterally
within normal enhancement pattern.

There are changes consistent with the given clinical history of
gastric bypass. Surrounding the gastric remnant and residual native
stomach is a significant amount of free intraperitoneal air and free
fluid. This raises suspicion for gastric ulcer within the gastric
remnant or the possibility of breakdown of the previous surgical
site. The remainder of the large and small bowel are within normal
limits.

The bladder is well distended. Mild free pelvic and free abdominal
fluid is noted as well.

A small air and fat containing umbilical hernia is noted. Aortic
calcifications are seen without aneurysmal dilatation. No findings
to suggest dissection are noted. The osseous structures show
degenerative change of the lumbar spine. No acute bony abnormality
is seen.

Review of the MIP images confirms the above findings.
IMPRESSION: No evidence of aortic dissection or aneurysmal dilatation.

Findings consistent with free air and free fluid within the abdomen
concentrated predominately around the area of prior gastric bypass.
These changes are likely related to a perforated gastric ulcer or
less likely breakdown of the surgical anastomosis.

Other chronic changes as described above.

Critical Value/emergent results were called by telephone at the time
of interpretation on 03/24/2016 at [DATE] to Dr. JORDEN DARDON ,
who verbally acknowledged these results.

## 2016-11-03 DIAGNOSIS — D692 Other nonthrombocytopenic purpura: Secondary | ICD-10-CM | POA: Insufficient documentation

## 2016-11-21 HISTORY — PX: SHOULDER SURGERY: SHX246

## 2017-04-16 DIAGNOSIS — G250 Essential tremor: Secondary | ICD-10-CM | POA: Insufficient documentation

## 2017-04-16 DIAGNOSIS — G5600 Carpal tunnel syndrome, unspecified upper limb: Secondary | ICD-10-CM | POA: Insufficient documentation

## 2017-04-21 ENCOUNTER — Other Ambulatory Visit: Payer: Self-pay | Admitting: Neurology

## 2017-04-21 DIAGNOSIS — G250 Essential tremor: Secondary | ICD-10-CM

## 2017-04-27 ENCOUNTER — Ambulatory Visit: Payer: Medicare Other

## 2017-07-23 ENCOUNTER — Ambulatory Visit: Payer: Federal, State, Local not specified - PPO

## 2017-08-13 ENCOUNTER — Ambulatory Visit
Admission: RE | Admit: 2017-08-13 | Discharge: 2017-08-13 | Disposition: A | Discharge status: Home / Self Care | Payer: Medicare Other | Source: Ambulatory Visit | Attending: Neurology | Admitting: Neurology

## 2017-08-13 DIAGNOSIS — G250 Essential tremor: Secondary | ICD-10-CM | POA: Insufficient documentation

## 2017-10-30 HISTORY — PX: CARPAL TUNNEL RELEASE: SHX101

## 2017-11-12 ENCOUNTER — Ambulatory Visit
Admission: RE | Admit: 2017-11-12 | Discharge: 2017-11-12 | Disposition: A | Discharge status: Home / Self Care | Payer: Medicare Other | Source: Ambulatory Visit | Attending: Orthopaedic Surgery | Admitting: Orthopaedic Surgery

## 2017-11-12 ENCOUNTER — Other Ambulatory Visit: Payer: Self-pay | Admitting: Orthopaedic Surgery

## 2017-11-12 ENCOUNTER — Other Ambulatory Visit
Admission: RE | Admit: 2017-11-12 | Discharge: 2017-11-12 | Disposition: A | Discharge status: Home / Self Care | Payer: Medicare Other | Source: Ambulatory Visit | Attending: Orthopaedic Surgery | Admitting: Orthopaedic Surgery

## 2017-11-12 DIAGNOSIS — M19032 Primary osteoarthritis, left wrist: Secondary | ICD-10-CM | POA: Insufficient documentation

## 2017-11-12 DIAGNOSIS — M79642 Pain in left hand: Secondary | ICD-10-CM | POA: Diagnosis present

## 2017-11-12 DIAGNOSIS — M7989 Other specified soft tissue disorders: Secondary | ICD-10-CM | POA: Diagnosis not present

## 2017-11-12 LAB — CBC WITH DIFFERENTIAL/PLATELET
BASOS ABS: 0.1 10*3/uL (ref 0–0.1)
Basophils Relative: 1 %
EOS PCT: 2 %
Eosinophils Absolute: 0.1 10*3/uL (ref 0–0.7)
HCT: 34.2 % — ABNORMAL LOW (ref 40.0–52.0)
Hemoglobin: 11.2 g/dL — ABNORMAL LOW (ref 13.0–18.0)
LYMPHS PCT: 14 %
Lymphs Abs: 0.8 10*3/uL — ABNORMAL LOW (ref 1.0–3.6)
MCH: 27.6 pg (ref 26.0–34.0)
MCHC: 32.8 g/dL (ref 32.0–36.0)
MCV: 84.1 fL (ref 80.0–100.0)
MONO ABS: 0.7 10*3/uL (ref 0.2–1.0)
MONOS PCT: 12 %
Neutro Abs: 3.9 10*3/uL (ref 1.4–6.5)
Neutrophils Relative %: 71 %
PLATELETS: 193 10*3/uL (ref 150–440)
RBC: 4.07 MIL/uL — ABNORMAL LOW (ref 4.40–5.90)
RDW: 14.5 % (ref 11.5–14.5)
WBC: 5.6 10*3/uL (ref 3.8–10.6)

## 2017-11-12 LAB — POCT I-STAT CREATININE: Creatinine, Ser: 0.7 mg/dL (ref 0.61–1.24)

## 2017-11-12 LAB — C-REACTIVE PROTEIN: CRP: 3.6 mg/dL — AB (ref <1.0)

## 2017-11-12 LAB — URIC ACID: URIC ACID, SERUM: 3.2 mg/dL — AB (ref 4.4–7.6)

## 2017-11-12 MED ORDER — IOPAMIDOL (ISOVUE-300) INJECTION 61%
75.0000 mL | Freq: Once | INTRAVENOUS | Status: AC | PRN
  Administered 2017-11-12: 75 mL via INTRAVENOUS

## 2017-11-13 LAB — FANA STAINING PATTERNS: Homogeneous Pattern: 1:640 {titer}

## 2017-11-13 LAB — RHEUMATOID FACTOR: RHEUMATOID FACTOR: 11.7 [IU]/mL (ref 0.0–13.9)

## 2017-11-13 LAB — ANTISTREPTOLYSIN O TITER: ASO: 65 IU/mL (ref 0.0–200.0)

## 2017-11-13 LAB — ANTINUCLEAR ANTIBODIES, IFA: ANTINUCLEAR ANTIBODIES, IFA: POSITIVE — AB

## 2017-11-17 LAB — AEROBIC/ANAEROBIC CULTURE (SURGICAL/DEEP WOUND)

## 2017-11-17 LAB — AEROBIC/ANAEROBIC CULTURE W GRAM STAIN (SURGICAL/DEEP WOUND)

## 2017-11-18 LAB — AEROBIC/ANAEROBIC CULTURE W GRAM STAIN (SURGICAL/DEEP WOUND)

## 2017-11-27 ENCOUNTER — Ambulatory Visit
Admission: RE | Admit: 2017-11-27 | Discharge: 2017-11-27 | Disposition: A | Discharge status: Home / Self Care | Payer: Medicare Other | Source: Ambulatory Visit | Attending: Internal Medicine | Admitting: Internal Medicine

## 2017-11-27 ENCOUNTER — Other Ambulatory Visit: Payer: Self-pay | Admitting: Internal Medicine

## 2017-11-27 DIAGNOSIS — M7989 Other specified soft tissue disorders: Secondary | ICD-10-CM | POA: Insufficient documentation

## 2018-02-11 ENCOUNTER — Encounter (INDEPENDENT_AMBULATORY_CARE_PROVIDER_SITE_OTHER): Payer: Self-pay

## 2018-02-11 ENCOUNTER — Encounter (INDEPENDENT_AMBULATORY_CARE_PROVIDER_SITE_OTHER): Payer: Self-pay | Admitting: Vascular Surgery

## 2018-02-26 ENCOUNTER — Other Ambulatory Visit (INDEPENDENT_AMBULATORY_CARE_PROVIDER_SITE_OTHER): Payer: Self-pay | Admitting: Vascular Surgery

## 2018-02-26 DIAGNOSIS — I10 Essential (primary) hypertension: Secondary | ICD-10-CM

## 2018-03-01 ENCOUNTER — Encounter (INDEPENDENT_AMBULATORY_CARE_PROVIDER_SITE_OTHER): Payer: Self-pay | Admitting: Vascular Surgery

## 2018-03-01 ENCOUNTER — Ambulatory Visit (INDEPENDENT_AMBULATORY_CARE_PROVIDER_SITE_OTHER): Payer: Medicare Other

## 2018-03-01 DIAGNOSIS — I10 Essential (primary) hypertension: Secondary | ICD-10-CM

## 2018-03-08 ENCOUNTER — Other Ambulatory Visit (INDEPENDENT_AMBULATORY_CARE_PROVIDER_SITE_OTHER): Payer: Self-pay | Admitting: Internal Medicine

## 2018-03-08 DIAGNOSIS — I10 Essential (primary) hypertension: Secondary | ICD-10-CM

## 2018-03-30 ENCOUNTER — Other Ambulatory Visit: Payer: Self-pay | Admitting: Internal Medicine

## 2018-03-30 DIAGNOSIS — I10 Essential (primary) hypertension: Secondary | ICD-10-CM

## 2018-04-13 ENCOUNTER — Ambulatory Visit
Admission: RE | Admit: 2018-04-13 | Discharge: 2018-04-13 | Disposition: A | Discharge status: Home / Self Care | Payer: Medicare Other | Source: Ambulatory Visit | Attending: Internal Medicine | Admitting: Internal Medicine

## 2018-04-13 DIAGNOSIS — I10 Essential (primary) hypertension: Secondary | ICD-10-CM | POA: Diagnosis not present

## 2018-04-13 DIAGNOSIS — I700 Atherosclerosis of aorta: Secondary | ICD-10-CM | POA: Insufficient documentation

## 2018-04-13 DIAGNOSIS — I701 Atherosclerosis of renal artery: Secondary | ICD-10-CM | POA: Insufficient documentation

## 2018-04-13 DIAGNOSIS — R911 Solitary pulmonary nodule: Secondary | ICD-10-CM | POA: Insufficient documentation

## 2018-04-13 LAB — POCT I-STAT CREATININE: CREATININE: 0.8 mg/dL (ref 0.61–1.24)

## 2018-04-13 MED ORDER — IOPAMIDOL (ISOVUE-370) INJECTION 76%
100.0000 mL | Freq: Once | INTRAVENOUS | Status: AC | PRN
  Administered 2018-04-13: 100 mL via INTRAVENOUS

## 2018-05-19 ENCOUNTER — Other Ambulatory Visit: Payer: Self-pay

## 2018-05-19 ENCOUNTER — Emergency Department: Payer: Medicare Other

## 2018-05-19 ENCOUNTER — Emergency Department
Admission: EM | Admit: 2018-05-19 | Discharge: 2018-05-19 | Disposition: A | Discharge status: Home / Self Care | Payer: Medicare Other | Attending: Emergency Medicine | Admitting: Emergency Medicine

## 2018-05-19 DIAGNOSIS — F17290 Nicotine dependence, other tobacco product, uncomplicated: Secondary | ICD-10-CM | POA: Diagnosis not present

## 2018-05-19 DIAGNOSIS — N3090 Cystitis, unspecified without hematuria: Secondary | ICD-10-CM

## 2018-05-19 DIAGNOSIS — I10 Essential (primary) hypertension: Secondary | ICD-10-CM | POA: Diagnosis not present

## 2018-05-19 DIAGNOSIS — R52 Pain, unspecified: Secondary | ICD-10-CM

## 2018-05-19 DIAGNOSIS — N451 Epididymitis: Secondary | ICD-10-CM

## 2018-05-19 DIAGNOSIS — Z79899 Other long term (current) drug therapy: Secondary | ICD-10-CM | POA: Insufficient documentation

## 2018-05-19 DIAGNOSIS — N50812 Left testicular pain: Secondary | ICD-10-CM

## 2018-05-19 DIAGNOSIS — N50819 Testicular pain, unspecified: Secondary | ICD-10-CM | POA: Diagnosis present

## 2018-05-19 LAB — URINALYSIS, COMPLETE (UACMP) WITH MICROSCOPIC
Bilirubin Urine: NEGATIVE
Glucose, UA: NEGATIVE mg/dL
Hgb urine dipstick: NEGATIVE
KETONES UR: NEGATIVE mg/dL
Nitrite: POSITIVE — AB
PH: 6 (ref 5.0–8.0)
PROTEIN: NEGATIVE mg/dL
Specific Gravity, Urine: 1.004 — ABNORMAL LOW (ref 1.005–1.030)

## 2018-05-19 MED ORDER — SULFAMETHOXAZOLE-TRIMETHOPRIM 800-160 MG PO TABS: 1.0000 | ORAL_TABLET | Freq: Two times a day (BID) | ORAL | 0 refills | Status: DC

## 2018-05-19 NOTE — ED Notes (Signed)
Pt assisted to use urinal. NAD.

## 2018-05-19 NOTE — ED Provider Notes (Signed)
[facility] Emergency Department Provider Note  ____________________________________________  Time seen: Approximately 4:40 PM  I have reviewed the triage vital signs and the nursing notes.   HISTORY  Chief Complaint Testicle Pain    HPI JAHMARI ESBENSHADE is a 72 y.o. male with a history of hypertension and GERD who complains of scrotal pain and swelling, worse on the left side, gradual onset and constant for the last 2 days.  No recent trauma.  No dysuria or hematuria urinary frequency or hematuria.  No rectal pain or painful bowel movements.      Past Medical History:  Diagnosis Date  . Arthritis   . GERD (gastroesophageal reflux disease)   . Gout   . Hypertension   . 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 Sleep apnea    cpap     There are no active problems to display for this patient.    Past Surgical History:  Procedure Laterality Date  . BACK SURGERY  2005   spinal stenosis  . barett's esophagus  2014  . COLONOSCOPY    . GASTRIC BYPASS  2014  . POLYPECTOMY    . removal melanoma  2014   forehead  . SHOULDER ARTHROSCOPY W/ ROTATOR CUFF REPAIR Right    x2  . TOTAL KNEE ARTHROPLASTY Left 2005  . TOTAL KNEE ARTHROPLASTY Right 2016  . UPPER GASTROINTESTINAL ENDOSCOPY       Prior to Admission medications   Medication Sig Start Date End Date Taking? Authorizing Provider  allopurinol (ZYLOPRIM) 300 MG tablet Take 300 mg by mouth daily.   Yes [provider]  carvedilol (COREG) 12.5 MG tablet Take 12.5 mg by mouth 2 (two) times daily. 05/14/18  Yes [provider]  Cholecalciferol (VITAMIN D3) 5000 UNITS TABS Take 5,000 Units by mouth daily.    Yes [provider]  clopidogrel (PLAVIX) 75 MG tablet Take 75 mg by mouth daily. 04/30/18  Yes [provider]  Cyanocobalamin (VITAMIN B-12) 2500 MCG SUBL Place 2,500 mcg under the tongue daily.    Yes [provider]  diphenhydramine-acetaminophen (TYLENOL  PM) 25-500 MG TABS Take 1 tablet by mouth at bedtime as needed (sleep).    Yes [provider]  doxazosin (CARDURA) 8 MG tablet Take 8 mg by mouth at bedtime.  06/26/15  Yes [provider]  flintstones complete (FLINTSTONES) 60 MG chewable tablet Chew 1 tablet by mouth daily.    Yes [provider]  hydrALAZINE (APRESOLINE) 100 MG tablet Take 100 mg by mouth 2 (two) times daily. 03/05/18  Yes [provider]  hydrochlorothiazide (HYDRODIURIL) 25 MG tablet Take 25 mg by mouth daily. 05/17/18  Yes [provider]  oxymetazoline (AFRIN) 0.05 % nasal spray Place 1 spray into both nostrils 2 (two) times daily.   Yes [provider]  pantoprazole (PROTONIX) 40 MG tablet Take 40 mg by mouth daily. 04/30/18  Yes [provider]  propranolol ER (INDERAL LA) 80 MG 24 hr capsule Take 80 mg by mouth daily. 03/01/18  Yes [provider]  telmisartan (MICARDIS) 80 MG tablet Take 80 mg by mouth daily. 04/30/18  Yes [provider]  traMADol (ULTRAM) 50 MG tablet Take 50 mg by mouth every 6 (six) hours as needed for pain. 02/15/18  Yes [provider]  sulfamethoxazole-trimethoprim (BACTRIM DS) 800-160 MG tablet Take 1 tablet by mouth 2 (two) times daily. 05/19/18   Carrie Mew, MD     Allergies Amlodipine and Nsaids  Family History  Problem Relation Age of Onset  . Prostate cancer Father   . Endometrial cancer Mother   . Colon cancer Neg Hx   . Esophageal cancer Neg Hx   . Rectal cancer Neg Hx   . Stomach cancer Neg Hx     Social History Social History   Tobacco Use  . Smoking status: Never Smoker  . Smokeless tobacco: Current User    Types: Snuff  Substance Use Topics  . Alcohol use: Yes    Alcohol/week: 0.0 oz    Comment: 1 glass wine a month  . Drug use: No    Review of Systems  Constitutional:   No fever or chills.  Cardiovascular:   No chest pain or syncope. Respiratory:   No dyspnea or  cough. Gastrointestinal:   Negative for abdominal pain, vomiting and diarrhea.  Musculoskeletal:   Negative for focal pain or swelling All other systems reviewed and are negative except as documented above in ROS and HPI.  ____________________________________________   PHYSICAL EXAM:  VITAL SIGNS: ED Triage Vitals  Enc Vitals Group     BP 05/19/18 1205 (!) 155/82     Pulse Rate 05/19/18 1205 (!) 58     Resp 05/19/18 1205 16     Temp 05/19/18 1205 98.9 F (37.2 C)     Temp Source 05/19/18 1205 Oral     SpO2 05/19/18 1205 98 %     Weight 05/19/18 1205 249 lb (112.9 kg)     Height 05/19/18 1205 5\' 9"  (1.753 m)     Head Circumference --      Peak Flow --      Pain Score 05/19/18 1204 8     Pain Loc --      Pain Edu? --      Excl. in [place]? --     Vital signs reviewed, nursing assessments reviewed.   Constitutional:   Alert and oriented. Non-toxic appearance. Eyes:   Conjunctivae are normal. EOMI. ENT      Head:   Normocephalic and atraumatic.      Mouth/Throat:   MMM, no pharyngeal erythema. No peritonsillar mass.       Neck:   No meningismus. Full ROM. Hematological/Lymphatic/Immunilogical:   No cervical lymphadenopathy.  No inguinal lymphadenopathy Cardiovascular:   RRR. Symmetric bilateral radial and DP pulses.  No murmurs. Cap refill less than 2 seconds. Respiratory:   Normal respiratory effort without tachypnea/retractions. Breath sounds are clear and equal bilaterally. No wheezes/rales/rhonchi. Gastrointestinal:   Soft with mild suprapubic tenderness . Non distended. No rebound, rigidity, or guarding. Genitourinary:   Grossly swollen scrotum with skin induration warmth and erythema.  There is tenderness on the left no significant tenderness on the right.  No fluctuance or drainage.  No crepitus or perineal inflammation Musculoskeletal:   Normal range of motion in all extremities. No joint effusions.  No lower extremity tenderness.  No edema. Neurologic:   Normal speech  and language.  Motor grossly intact. No acute focal neurologic deficits are appreciated.  Skin:    Skin is warm, dry and intact. No rash noted.  No petechiae, purpura, or bullae.  ____________________________________________    LABS (pertinent positives/negatives) (all labs ordered are listed, but only abnormal results are displayed) Labs Reviewed  URINALYSIS, COMPLETE (UACMP) WITH MICROSCOPIC - Abnormal; Notable for the following components:      Result Value   Color, Urine STRAW (*)    APPearance CLEAR (*)    Specific Gravity, Urine 1.004 (*)  Nitrite POSITIVE (*)    Leukocytes, UA SMALL (*)    Bacteria, UA MANY (*)    All other components within normal limits  URINE CULTURE   ____________________________________________   EKG    ____________________________________________    RADIOLOGY  US Scrotum W/doppler  Result Date: 05/19/2018 CLINICAL DATA:  LEFT testicular pain for 2 days, BILATERAL scrotal swelling and penile swelling with redness EXAM: SCROTAL ULTRASOUND DOPPLER ULTRASOUND OF THE TESTICLES TECHNIQUE: Complete ultrasound examination of the testicles, epididymis, and other scrotal structures was performed. Color and spectral Doppler ultrasound were also utilized to evaluate blood flow to the testicles. COMPARISON:  None FINDINGS: Right testicle Measurements: 4.2 x 3.7 x 3.1 cm. Normal morphology without mass or calcification. Internal blood flow present on color Doppler imaging. Left testicle Measurements: 4.4 x 3.6 x 3.3 cm. Normal morphology without mass or calcification. Internal blood flow present on color Doppler imaging. Right epididymis:  Normal in size and appearance. Left epididymis:  Normal in size and appearance. Hydrocele: Large LEFT hydrocele. Moderate-sized complex RIGHT hydrocele collection with multiple septations and scattered areas of echogenicity, question hematocele versus pyocele. Varicocele:  None visualized. Pulsed Doppler interrogation of both  testes demonstrates normal low resistance arterial and venous waveforms bilaterally. IMPRESSION: Unremarkable testes and epididymi. Large LEFT hydrocele. Complicated septated moderate-sized RIGHT hydrocele collection question hematocele versus pyocele. Electronically Signed   By: Lavonia Dana M.D.   On: 05/19/2018 13:56    ____________________________________________   PROCEDURES Procedures  ____________________________________________    CLINICAL IMPRESSION / ASSESSMENT AND PLAN / ED COURSE  Pertinent labs & imaging results that were available during my care of the patient were reviewed by me and considered in my medical decision making (see chart for details).    Patient presents with scrotal pain and swelling, by exam inflamed, likely infectious process.  Doubt scrotal abscess.  Ultrasound shows hydrocele bilaterally.  Raises suspicion of a complex fluid on the right, but symptoms are much milder on the right side and clinically he has a left-sided epididymitis which the ultrasound is consistent with.  Check urine, discussed with urology.  Clinical Course as of May 20 1639  Wed May 19, 2018  1525 UA c/w UTI. Presentation c/w epididymitis, likely coliform bacteria. Urine culture sent. Will start on bactrim, f/u urology. D/w Dr. Erlene Quan who agrees with current plan.    [PS]    Clinical Course User Index [PS] Carrie Mew, MD     ____________________________________________   FINAL CLINICAL IMPRESSION(S) / ED DIAGNOSES    Final diagnoses:  Pain  Epididymitis  Cystitis     ED Discharge Orders        Ordered    sulfamethoxazole-trimethoprim (BACTRIM DS) 800-160 MG tablet  2 times daily     05/19/18 1639      Portions of this note were generated with dragon dictation software. Dictation errors may occur despite best attempts at proofreading.    Carrie Mew, MD 05/19/18 [phone]

## 2018-05-19 NOTE — ED Notes (Signed)
Pt taken to US

## 2018-05-19 NOTE — ED Triage Notes (Signed)
Pt states "my L testicle is sore." states symptoms x 2 days. Denies urinary symptoms. Denies injury. Alert, oriented. In wheelchair. Pain with movement.

## 2018-05-19 NOTE — Discharge Instructions (Addendum)
Results for orders placed or performed during the hospital encounter of 05/19/18  Urinalysis, Complete w Microscopic  Result Value Ref Range   Color, Urine STRAW (A) YELLOW   APPearance CLEAR (A) CLEAR   Specific Gravity, Urine 1.004 (L) 1.005 - 1.030   pH 6.0 5.0 - 8.0   Glucose, UA NEGATIVE NEGATIVE mg/dL   Hgb urine dipstick NEGATIVE NEGATIVE   Bilirubin Urine NEGATIVE NEGATIVE   Ketones, ur NEGATIVE NEGATIVE mg/dL   Protein, ur NEGATIVE NEGATIVE mg/dL   Nitrite POSITIVE (A) NEGATIVE   Leukocytes, UA SMALL (A) NEGATIVE   RBC / HPF 0-5 0 - 5 RBC/hpf   WBC, UA 6-10 0 - 5 WBC/hpf   Bacteria, UA MANY (A) NONE SEEN   Squamous Epithelial / LPF 0-5 0 - 5   US Scrotum W/doppler  Result Date: 05/19/2018 CLINICAL DATA:  LEFT testicular pain for 2 days, BILATERAL scrotal swelling and penile swelling with redness EXAM: SCROTAL ULTRASOUND DOPPLER ULTRASOUND OF THE TESTICLES TECHNIQUE: Complete ultrasound examination of the testicles, epididymis, and other scrotal structures was performed. Color and spectral Doppler ultrasound were also utilized to evaluate blood flow to the testicles. COMPARISON:  None FINDINGS: Right testicle Measurements: 4.2 x 3.7 x 3.1 cm. Normal morphology without mass or calcification. Internal blood flow present on color Doppler imaging. Left testicle Measurements: 4.4 x 3.6 x 3.3 cm. Normal morphology without mass or calcification. Internal blood flow present on color Doppler imaging. Right epididymis:  Normal in size and appearance. Left epididymis:  Normal in size and appearance. Hydrocele: Large LEFT hydrocele. Moderate-sized complex RIGHT hydrocele collection with multiple septations and scattered areas of echogenicity, question hematocele versus pyocele. Varicocele:  None visualized. Pulsed Doppler interrogation of both testes demonstrates normal low resistance arterial and venous waveforms bilaterally. IMPRESSION: Unremarkable testes and epididymi. Large LEFT hydrocele.  Complicated septated moderate-sized RIGHT hydrocele collection question hematocele versus pyocele. Electronically Signed   By: Lavonia Dana M.D.   On: 05/19/2018 13:56

## 2018-05-21 LAB — URINE CULTURE: Culture: >=100000 — AB

## 2018-05-28 ENCOUNTER — Encounter: Payer: Self-pay | Admitting: Urology

## 2018-05-28 ENCOUNTER — Ambulatory Visit (INDEPENDENT_AMBULATORY_CARE_PROVIDER_SITE_OTHER): Payer: Medicare Other | Admitting: Urology

## 2018-05-28 VITALS — BP 136/74 | HR 64 | Ht 66.0 in | Wt 243.4 lb

## 2018-05-28 DIAGNOSIS — N453 Epididymo-orchitis: Secondary | ICD-10-CM

## 2018-05-28 DIAGNOSIS — N432 Other hydrocele: Secondary | ICD-10-CM | POA: Diagnosis not present

## 2018-05-28 DIAGNOSIS — N5082 Scrotal pain: Secondary | ICD-10-CM | POA: Diagnosis not present

## 2018-05-28 NOTE — Patient Instructions (Signed)
Follow up as needed if recurrent UTIs or worsening scrotal pain

## 2018-05-28 NOTE — Progress Notes (Signed)
05/28/2018 12:06 PM   Raye Sorrow 17-Apr-1946 [id number]  Referring provider: Adin Hector, MD [facility] [place], [place] [address]  CC: Follow up left scrotal pain  HPI: Mr. Mazzie is a comorbid 72 year old male here for follow-up of ER visit for left-sided scrotal pain.  His past medical history is notable for history of TIA, now on Plavix.  He presented to the emergency room May 19, 2018 with left-sided scrotal pain.  Urinalysis was infected and ultimately grew E. coli. Testicular ultrasound at that time showed bilateral moderate hydroceles, complex on the right side.  No abscesses were seen.  He was treated with 10-day course of Bactrim.  He reports significant improvement in his scrotal pain since that time, and denies any fevers, chills.  Though his ultrasound did not show any signs of epididymitis or orchitis at that time, his clinical picture is most consistent with resolving orchitis or epididymitis.   PMH: Past Medical History:  Diagnosis Date  . Arthritis   . GERD (gastroesophageal reflux disease)   . Gout   . Hypertension   . Melanoma ([place]) 2014   forehead  . Sleep apnea    cpap    Surgical History: Past Surgical History:  Procedure Laterality Date  . BACK SURGERY  2005   spinal stenosis  . barett's esophagus  2014  . COLONOSCOPY    . GASTRIC BYPASS  2014  . POLYPECTOMY    . removal melanoma  2014   forehead  . SHOULDER ARTHROSCOPY W/ ROTATOR CUFF REPAIR Right    x2  . TOTAL KNEE ARTHROPLASTY Left 2005  . TOTAL KNEE ARTHROPLASTY Right 2016  . UPPER GASTROINTESTINAL ENDOSCOPY      Allergies:  Allergies  Allergen Reactions  . Amlodipine Other (See Comments)    Worsened edema.  . Nsaids Other (See Comments)    Not supposed to take due to gastric bypass surgery  . Tolmetin Other (See Comments)    Not supposed to take due to gastric bypass surgery    Family History: Family History  Problem Relation Age of Onset   . Prostate cancer Father   . Endometrial cancer Mother   . Colon cancer Neg Hx   . Esophageal cancer Neg Hx   . Rectal cancer Neg Hx   . Stomach cancer Neg Hx     Social History:  reports that he has never smoked. His smokeless tobacco use includes snuff. He reports that he drinks alcohol. He reports that he does not use drugs.  ROS: Please see flowsheet from today's date for complete review of systems.  Physical Exam: BP 136/74 (BP Location: Left Arm, Patient Position: Sitting, Cuff Size: Large)   Pulse 64   Ht 5\' 6"  (1.676 m)   Wt 243 lb 6.4 oz (110.4 kg)   BMI 39.29 kg/m   Constitutional:  Slow to respond, No acute distress. Cardiovascular: No clubbing, cyanosis, or edema. Respiratory: Normal respiratory effort, no increased work of breathing. GI: Abdomen is soft, nontender, nondistended, no abdominal masses GU: phallus without lesions, widely patent meatus, Bilateral hydroceles. Left scrotum slightly firm, no erythema or induration. Minimally tender bilaterally. Lymph: No cervical or inguinal lymphadenopathy. Skin: No rashes, bruises or suspicious lesions. Neurologic: Grossly intact, no focal deficits, moving all 4 extremities. Psychiatric: Slightly delayed affect  Laboratory Data: Lab Results  Component Value Date   WBC 5.6 11/12/2017   HGB 11.2 (L) 11/12/2017   HCT 34.2 (L) 11/12/2017   MCV  84.1 11/12/2017   PLT 193 11/12/2017    Lab Results  Component Value Date   CREATININE 0.80 04/13/2018    Urinalysis    Component Value Date/Time   COLORURINE STRAW (A) 05/19/2018 1427   APPEARANCEUR CLEAR (A) 05/19/2018 1427   LABSPEC 1.004 (L) 05/19/2018 1427   PHURINE 6.0 05/19/2018 1427   GLUCOSEU NEGATIVE 05/19/2018 1427   HGBUR NEGATIVE 05/19/2018 1427   BILIRUBINUR NEGATIVE 05/19/2018 1427   KETONESUR NEGATIVE 05/19/2018 1427   PROTEINUR NEGATIVE 05/19/2018 1427   NITRITE POSITIVE (A) 05/19/2018 1427   LEUKOCYTESUR SMALL (A) 05/19/2018 1427    Lab Results   Component Value Date   BACTERIA MANY (A) 05/19/2018  E Coli  Pertinent Imaging:  I have personally reviewed the scrotal ultrasound from 05/19/2018  Assessment & Plan:   In summary, Mr. Drum is a 72 year old comorbid male here for follow-up from the emergency room for likely left-sided orchitis or epididymitis.  His urine culture at that time grew E. coli and he is since improved significantly with a 10-day course of Bactrim. Ultrasound in the ED showed bilateral hydroceles. His exam currently is benign and consistent with resolving orchitis. Encouraged scrotal support, elevation, and compression shorts.   - Follow up as needed if develops recurrent UTIs or scrotal pain - Would not recommend surgical treatment for his minimally symptomatic bilateral hydroceles in the setting of minimal bother and plavix for history of TIA  Billey Co, MD  [facility] [address] [place], [place] [address] [phone]

## 2018-08-06 ENCOUNTER — Other Ambulatory Visit: Payer: Self-pay | Admitting: Internal Medicine

## 2018-08-06 DIAGNOSIS — M4807 Spinal stenosis, lumbosacral region: Secondary | ICD-10-CM

## 2018-08-20 ENCOUNTER — Ambulatory Visit
Admission: RE | Admit: 2018-08-20 | Discharge: 2018-08-20 | Disposition: A | Discharge status: Home / Self Care | Payer: Medicare Other | Source: Ambulatory Visit | Attending: Internal Medicine | Admitting: Internal Medicine

## 2018-08-20 DIAGNOSIS — M4807 Spinal stenosis, lumbosacral region: Secondary | ICD-10-CM

## 2018-08-20 DIAGNOSIS — M47816 Spondylosis without myelopathy or radiculopathy, lumbar region: Secondary | ICD-10-CM | POA: Insufficient documentation

## 2018-08-20 DIAGNOSIS — M48061 Spinal stenosis, lumbar region without neurogenic claudication: Secondary | ICD-10-CM | POA: Insufficient documentation

## 2018-11-29 DIAGNOSIS — M48062 Spinal stenosis, lumbar region with neurogenic claudication: Secondary | ICD-10-CM | POA: Insufficient documentation

## 2019-04-19 ENCOUNTER — Other Ambulatory Visit: Payer: Self-pay

## 2019-04-19 ENCOUNTER — Ambulatory Visit
Admission: RE | Admit: 2019-04-19 | Discharge: 2019-04-19 | Disposition: A | Discharge status: Home / Self Care | Payer: Medicare Other | Source: Ambulatory Visit | Attending: Family Medicine | Admitting: Family Medicine

## 2019-04-19 ENCOUNTER — Other Ambulatory Visit: Payer: Self-pay | Admitting: Family Medicine

## 2019-04-19 DIAGNOSIS — N5089 Other specified disorders of the male genital organs: Secondary | ICD-10-CM | POA: Diagnosis present

## 2019-04-25 DIAGNOSIS — M109 Gout, unspecified: Secondary | ICD-10-CM | POA: Insufficient documentation

## 2019-04-25 NOTE — Progress Notes (Signed)
04/26/2019 4:34 PM   Randall [name] June 15, 1946 [id number]  Referring provider: Adin Hector, MD [facility] [place],  [place] [id number]  Chief Complaint  Patient presents with  . Hydrocele    HPI: Randall [name] is a 73 year old male with scrotal swelling who is referred by Randall [name].    He was last seen in our office for similar complaints in 05/2018 by Randall [name].  Scrotal ultrasound in 04/2018 noted unremarkable testes and epididymi.  Large LEFT hydrocele.  Complicated septated moderate-sized RIGHT hydrocele collection question hematocele versus pyocele.   It was recommended at that time to complete his antibiotic and to manage his hydroceles conservatively as he was on Plavix for a history of TIA's.    Recently he was placed on Cipro, for scrotal swelling and pain.  Recent scrotal ultrasound on 04/19/2019 noted large simple bilateral hydroceles.  Mild overlying scrotal wall thickening, which could be related to overall volume status. Possible infection/cellulitis could also be considered in the correct clinical setting. No discrete soft tissue collections.  Otherwise normal scrotal ultrasound with normal sonographic appearance of the testicles and epididymi. No evidence for torsion or other acute finding.    Today, he states the swelling has made it difficult to urinate as his penis is tucked into the scrotum and urine sprays all over the commode.  He also states the scrotum is uncomfortable as it feel full and weighted down.  He did says he has noticed a slight decrease in the scrotal swelling since one month ago.  The uncomfortableness has also decreased.  Patient denies any gross hematuria, dysuria or suprapubic/flank pain.  Patient denies any fevers, chills, nausea or vomiting.   His UA today is negative.     PMH: Past Medical History:  Diagnosis Date  . Arthritis   . GERD (gastroesophageal reflux disease)   . Gout   . Hypertension    . Melanoma ([place]) 2014   forehead  . Sleep apnea    cpap    Surgical History: Past Surgical History:  Procedure Laterality Date  . BACK SURGERY  2005   spinal stenosis  . barett's esophagus  2014  . COLONOSCOPY    . GASTRIC BYPASS  2014  . POLYPECTOMY    . removal melanoma  2014   forehead  . SHOULDER ARTHROSCOPY W/ ROTATOR CUFF REPAIR Right    x2  . TOTAL KNEE ARTHROPLASTY Left 2005  . TOTAL KNEE ARTHROPLASTY Right 2016  . UPPER GASTROINTESTINAL ENDOSCOPY      Home Medications:  Allergies as of 04/26/2019      Reactions   Amlodipine Other (See Comments)   Worsened edema.   Nsaids Other (See Comments)   Not supposed to take due to gastric bypass surgery   Tolmetin Other (See Comments)   Not supposed to take due to gastric bypass surgery      Medication List       Accurate as of April 26, 2019  4:34 PM. If you have any questions, ask your nurse or doctor.        STOP taking these medications   fluocinonide 0.05 % external solution Commonly known as: LIDEX Stopped by: Randall Fichter, [name]-C   hydrochlorothiazide 25 MG tablet Commonly known as: HYDRODIURIL Stopped by: Randall Michie, [name]-C   ondansetron 8 MG tablet Commonly known as: ZOFRAN Stopped by: Randall Council, [name]-C     TAKE these medications   allopurinol 300 MG  tablet Commonly known as: ZYLOPRIM Take 300 mg by mouth daily.   carvedilol 12.5 MG tablet Commonly known as: COREG Take 12.5 mg by mouth 2 (two) times daily.   clopidogrel 75 MG tablet Commonly known as: PLAVIX Take 75 mg by mouth daily.   colchicine 0.6 MG tablet colchicine 0.6 mg tablet  Take 1 tablet twice a day by oral route for 7 days.   diphenhydramine-acetaminophen 25-500 MG Tabs tablet Commonly known as: TYLENOL PM Take 1 tablet by mouth at bedtime as needed (sleep).   doxazosin 8 MG tablet Commonly known as: CARDURA Take 8 mg by mouth at bedtime.   flintstones complete 60 MG chewable tablet Chew 1 tablet by mouth  daily.   hydrALAZINE 100 MG tablet Commonly known as: APRESOLINE Take 100 mg by mouth 2 (two) times daily.   ibuprofen 800 MG tablet Commonly known as: ADVIL ibuprofen 800 mg tablet  Take one tablet po tid.   losartan 100 MG tablet Commonly known as: COZAAR losartan 100 mg tablet   Neurontin 300 MG capsule Generic drug: gabapentin Neurontin 300 mg capsule   omeprazole 40 MG capsule Commonly known as: PRILOSEC omeprazole 40 mg capsule,delayed release   oxymetazoline 0.05 % nasal spray Commonly known as: AFRIN Place 1 spray into both nostrils 2 (two) times daily.   pantoprazole 40 MG tablet Commonly known as: PROTONIX Take 40 mg by mouth daily.   propranolol ER 80 MG 24 hr capsule Commonly known as: INDERAL LA Take 80 mg by mouth daily.   Roxicodone 5 MG immediate release tablet Generic drug: oxyCODONE Roxicodone 5 mg tablet  Take one tablet po q 4-6 hours prn pain.   sulfamethoxazole-trimethoprim 800-160 MG tablet Commonly known as: Bactrim DS Take 1 tablet by mouth 2 (two) times daily.   telmisartan 80 MG tablet Commonly known as: MICARDIS Take 80 mg by mouth daily.   traMADol 50 MG tablet Commonly known as: ULTRAM Take 50 mg by mouth every 6 (six) hours as needed for pain.   triamcinolone 0.025 % cream Commonly known as: KENALOG triamcinolone acetonide 0.025 % topical cream   Vitamin B-12 2500 MCG Subl Place 2,500 mcg under the tongue daily.   Vitamin D3 125 MCG (5000 UT) Tabs Take 5,000 Units by mouth daily.       Allergies:  Allergies  Allergen Reactions  . Amlodipine Other (See Comments)    Worsened edema.  . Nsaids Other (See Comments)    Not supposed to take due to gastric bypass surgery  . Tolmetin Other (See Comments)    Not supposed to take due to gastric bypass surgery    Family History: Family History  Problem Relation Age of Onset  . Prostate cancer Father   . Endometrial cancer Mother   . Colon cancer Neg Hx   . Esophageal  cancer Neg Hx   . Rectal cancer Neg Hx   . Stomach cancer Neg Hx     Social History:  reports that he has never smoked. His smokeless tobacco use includes snuff. He reports current alcohol use. He reports that he does not use drugs.  ROS: UROLOGY Frequent Urination?: No Hard to postpone urination?: No Burning/pain with urination?: No Get up at night to urinate?: No Leakage of urine?: No Urine stream starts and stops?: No Trouble starting stream?: No Do you have to strain to urinate?: No Blood in urine?: No Urinary tract infection?: Yes Sexually transmitted disease?: No Injury to kidneys or bladder?: No Painful intercourse?: No Weak  stream?: No Erection problems?: No Penile pain?: No  Gastrointestinal Nausea?: No Vomiting?: No Indigestion/heartburn?: No Diarrhea?: No Constipation?: No  Constitutional Fever: No Night sweats?: No Weight loss?: No Fatigue?: No  Skin Skin rash/lesions?: No Itching?: No  Eyes Blurred vision?: No Double vision?: No  Ears/Nose/Throat Sore throat?: No Sinus problems?: No  Hematologic/Lymphatic Swollen glands?: No Easy bruising?: No  Cardiovascular Leg swelling?: No Chest pain?: No  Respiratory Cough?: No Shortness of breath?: No  Endocrine Excessive thirst?: No  Musculoskeletal Back pain?: No Joint pain?: No  Neurological Headaches?: No Dizziness?: No  Psychologic Depression?: No Anxiety?: No  Physical Exam: BP (!) 153/83   Pulse (!) 58   Ht 5\' 6"  (1.676 m)   Wt 258 lb (117 kg)   BMI 41.64 kg/m   Constitutional:  Well nourished. Alert and oriented, No acute distress. HEENT: [place] AT, moist mucus membranes.  Trachea midline, no masses. Cardiovascular: No clubbing, cyanosis, or edema. Respiratory: Normal respiratory effort, no increased work of breathing. GI: Abdomen is soft, non tender, non distended, no abdominal masses. Liver and spleen not palpable.  No hernias appreciated.  Stool sample for occult  testing is not indicated.   GU: No CVA tenderness.  No bladder fullness or masses.  Patient with buried phallus.  Foreskin easily retracted  Urethral meatus is patent.  No penile discharge. No penile lesions or rashes. Scrotum without lesions, cysts, rashes and/or edema.  Large bilateral hydroceles.   Rectal: Patient with  normal sphincter tone. Anus and perineum without scarring or rashes. No rectal masses are appreciated. Prostate is approximately 50 grams, could only palpate the apex and midportion of the gland, no nodules are appreciated.  Skin: No rashes, bruises or suspicious lesions. Lymph: No cervical or inguinal adenopathy. Neurologic: Grossly intact, no focal deficits, moving all 4 extremities. Psychiatric: Normal mood and affect.  Laboratory Data: Lab Results  Component Value Date   WBC 5.6 11/12/2017   HGB 11.2 (L) 11/12/2017   HCT 34.2 (L) 11/12/2017   MCV 84.1 11/12/2017   PLT 193 11/12/2017    Lab Results  Component Value Date   CREATININE 0.80 04/13/2018    No results found for: PSA  No results found for: TESTOSTERONE  No results found for: HGBA1C  No results found for: TSH  No results found for: CHOL, HDL, CHOLHDL, VLDL, LDLCALC  Lab Results  Component Value Date   AST 35 03/24/2016   Lab Results  Component Value Date   ALT 23 03/24/2016   No components found for: ALKALINEPHOPHATASE No components found for: BILIRUBINTOTAL  No results found for: ESTRADIOL  Urinalysis    Component Value Date/Time   COLORURINE STRAW (A) 05/19/2018 1427   APPEARANCEUR CLEAR (A) 05/19/2018 1427   LABSPEC 1.004 (L) 05/19/2018 1427   PHURINE 6.0 05/19/2018 1427   GLUCOSEU NEGATIVE 05/19/2018 1427   HGBUR NEGATIVE 05/19/2018 1427   BILIRUBINUR NEGATIVE 05/19/2018 1427   KETONESUR NEGATIVE 05/19/2018 1427   PROTEINUR NEGATIVE 05/19/2018 1427   NITRITE POSITIVE (A) 05/19/2018 1427   LEUKOCYTESUR SMALL (A) 05/19/2018 1427    I have reviewed the labs.   Pertinent  Imaging: CLINICAL DATA:  Initial evaluation for left testicular swelling since June 11th.  EXAM: SCROTAL ULTRASOUND  DOPPLER ULTRASOUND OF THE TESTICLES  TECHNIQUE: Complete ultrasound examination of the testicles, epididymis, and other scrotal structures was performed. Color and spectral Doppler ultrasound were also utilized to evaluate blood flow to the testicles.  COMPARISON:  None available.  FINDINGS: Right testicle  Measurements: 5.1 x 3.5 x 2.7 cm. No mass or microlithiasis visualized. Small testicular appendix noted.  Left testicle  Measurements: 4.7 x 2.8 x 3.3 cm. No mass or microlithiasis visualized. Small testicular appendix noted.  Right epididymis:  Normal in size and appearance.  Left epididymis:  Normal in size and appearance.  Hydrocele:  Large predominantly simple bilateral hydroceles.  Varicocele:  None visualized.  Pulsed Doppler interrogation of both testes demonstrates normal low resistance arterial and venous waveforms bilaterally.  Mild diffuse and fairly homogeneous thickening of the overlying scrotal wall.  IMPRESSION: 1. Large simple bilateral hydroceles. 2. Mild overlying scrotal wall thickening, which could be related to overall volume status. Possible infection/cellulitis could also be considered in the correct clinical setting. No discrete soft tissue collections. 3. Otherwise normal scrotal ultrasound with normal sonographic appearance of the testicles and epididymi. No evidence for torsion or other acute finding.   Electronically Signed   By: Jeannine Boga M.D.   On: 04/19/2019 17:38  I have independently reviewed the films.    Assessment & Plan:    1. Bilateral hydroceles Explained to the patient that a hydrocele is a collection of peritoneal fluid between the parietal and visceral layers of the tunica vaginalis, which directly surrounds the testis and spermatic cord.  It is the same layer that  forms the peritoneal lining of the abdomen. Hydroceles are believed to arise from an imbalance of secretion and reabsorption of fluid from the tunica vaginalis.  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   Explained that the most common surgical procedure is excision of the hydrocele sac. Simple aspiration is generally unsuccessful because of the rapid re accumulation of fluid but may be effective if combined with instillation of a sclerosing agent (eg, tetracycline, alcohol) into the sac. The potential risks of this approach are a low incidence of reactive orchitis/epididymitis and a higher rate of recurrence, which may then make open surgery more difficult because of the development of adhesions between the hydrocele sac and the scrotal contents.  As he feels the scrotal swelling has improved somewhat, he would like to give it more time before deciding on proceeding with a hydrocelectomy.   RTC in one month for recheck.     Return in about 1 month (around 05/27/2019) for recheck .  These notes generated with voice recognition software. I apologize for typographical errors.  Randall Council, [name]-C  [facility] [address]  [place], [place] [address] [phone]

## 2019-04-26 ENCOUNTER — Encounter: Payer: Self-pay | Admitting: Urology

## 2019-04-26 ENCOUNTER — Other Ambulatory Visit: Payer: Self-pay

## 2019-04-26 ENCOUNTER — Ambulatory Visit (INDEPENDENT_AMBULATORY_CARE_PROVIDER_SITE_OTHER): Payer: Medicare Other | Admitting: Urology

## 2019-04-26 VITALS — BP 153/83 | HR 58 | Ht 66.0 in | Wt 258.0 lb

## 2019-04-26 DIAGNOSIS — N432 Other hydrocele: Secondary | ICD-10-CM | POA: Diagnosis not present

## 2019-04-27 LAB — URINALYSIS, COMPLETE
Bilirubin, UA: NEGATIVE
Glucose, UA: NEGATIVE
Leukocytes,UA: NEGATIVE
Nitrite, UA: NEGATIVE
Protein,UA: NEGATIVE
RBC, UA: NEGATIVE
Specific Gravity, UA: 1.025 (ref 1.005–1.030)
Urobilinogen, Ur: 0.2 mg/dL (ref 0.2–1.0)
pH, UA: 5 (ref 5.0–7.5)

## 2019-04-27 LAB — MICROSCOPIC EXAMINATION
Bacteria, UA: NONE SEEN
RBC, Urine: NONE SEEN /hpf (ref 0–2)

## 2019-06-01 ENCOUNTER — Ambulatory Visit: Payer: Medicare Other | Admitting: Urology

## 2019-06-20 ENCOUNTER — Ambulatory Visit (INDEPENDENT_AMBULATORY_CARE_PROVIDER_SITE_OTHER): Payer: Medicare Other | Admitting: Urology

## 2019-06-20 ENCOUNTER — Other Ambulatory Visit: Payer: Self-pay

## 2019-06-20 ENCOUNTER — Encounter: Payer: Self-pay | Admitting: Urology

## 2019-06-20 VITALS — BP 154/75 | HR 59 | Ht 66.0 in | Wt 258.0 lb

## 2019-06-20 DIAGNOSIS — N432 Other hydrocele: Secondary | ICD-10-CM

## 2019-06-20 DIAGNOSIS — N390 Urinary tract infection, site not specified: Secondary | ICD-10-CM

## 2019-06-20 NOTE — Patient Instructions (Signed)
1. Wear snug fitting compression underwear 2. Ice scrotum as needed for 20 minutes at a time 3. Take cranberry tablets twice daily to prevent UTI   Hydrocelectomy, Adult  A hydrocelectomy is a surgical procedure to remove a collection of fluid (hydrocele) from the pouch that holds the testicles (scrotum). You may need to have a hydrocelectomy if a hydrocele is making your scrotum swell painfully. Tell a health care provider about:  Any allergies you have.  All medicines you are taking, including vitamins, herbs, eye drops, creams, and over-the-counter medicines.  Any problems you or family members have had with anesthetic medicines.  Any blood disorders you have.  Any surgeries you have had.  Any medical conditions you have. What are the risks? Generally this is a safe procedure. However, problems may occur, including:  Bleeding into the scrotum (scrotal hematoma).  Damage to the testicle or the tube that carries sperm out of the testicle (vas deferens).  Infection.  Allergic reactions to medicines. What happens before the procedure? Staying hydrated Follow instructions from your health care provider about hydration, which may include:  Up to 2 hours before the procedure - you may continue to drink clear liquids, such as water, clear fruit juice, black coffee, and plain tea. Eating and drinking restrictions Follow instructions from your health care provider about eating and drinking, which may include:  8 hours before the procedure - stop eating heavy meals or foods such as meat, fried foods, or fatty foods.  6 hours before the procedure - stop eating light meals or foods, such as toast or cereal.  6 hours before the procedure - stop drinking milk or drinks that contain milk.  2 hours before the procedure - stop drinking clear liquids. Medicines  Ask your health care provider about: ? Changing or stopping your regular medicines. This is especially important if you are  taking diabetes medicines or blood thinners. ? Taking medicines such as aspirin and ibuprofen. These medicines can thin your blood. Do not take these medicines before your procedure if your health care provider instructs you not to.  You may be given antibiotic medicine to help prevent infection. General instructions  Plan to have someone take you home after the procedure. What happens during the procedure?  To reduce your risk of infection: ? Your health care team will wash or sanitize their hands. ? A germ-killing solution (antiseptic) will be used to wash your scrotum and the area around it. Hair may be removed from this area.  An IV tube will be inserted into one of your veins.  You will be given one or more of the following: ? A medicine to make you relax (sedative). ? A medicine to make you fall asleep (general anesthetic).  A small incision will be made through the skin of your scrotum.  Your testicle and the hydrocele will be located, and the hydrocele sac will be opened with an incision.  The fluid will be drained from the hydrocele.  The hydrocele will be closed with absorbable stitches (sutures) to prevent fluid from building up again.  If you have a large hydrocele, a thin rubber drain may be placed to allow fluid to drain after the procedure.  The incision in your scrotum will be closed with absorbable sutures.  A bandage (dressing) will be placed over the incision. The procedure may vary among health care providers and hospitals. What happens after the procedure?  Your blood pressure, heart rate, breathing rate, and blood oxygen level  will be monitored until the medicines you were given have worn off.  You will be given pain medicine as needed.  Do not drive for 24 hours if you were given a sedative.  You may have to wear an athletic support strap to hold the dressing in place and support your scrotum. This information is not intended to replace advice given to  you by your health care provider. Make sure you discuss any questions you have with your health care provider. Document Released: 06/27/2015 Document Revised: 09/18/2017 Document Reviewed: 07/05/2016 Elsevier Patient Education  2020 [employer].

## 2019-06-20 NOTE — Progress Notes (Signed)
   06/20/2019 11:55 AM   Raye Sorrow 1946/01/24 [id number]  Reason for visit: Follow up hydrocele  HPI: I saw Mr. Matzinger in follow-up today for left-sided hydrocele.  He is a very co-morbid 73 year old male on Plavix for history of TIA with 3 prior UTIs.  He originally had a UTI with left orchitis summer 2019 and I saw him in clinic in August 2019 for follow-up.  His symptoms had improved at that time but he had a persistent mild left simple hydrocele.  We had elected conservative management with his comorbidities and Plavix.  He had another bout of culture documented UTI and orchitis in June 2020, and complains of some persistent left-sided scrotal swelling.  This is non-painful, however the bulkiness of his hydrocele does interfere with sitting sometimes.  His testicular pain is resolved after antibiotic treatment of his UTI.  There are no aggravating or alleviating factors.  Severity is mild.  He continues to wear boxers.  He also complains of some spraying stream with urination.  CT in June 2019 showed no hydronephrosis or nephrolithiasis.   ROS: Please see flowsheet from today's date for complete review of systems.  Physical Exam: BP (!) 154/75 (BP Location: Left Arm, Patient Position: Sitting, Cuff Size: Normal)   Pulse (!) 59   Ht 5\' 6"  (1.676 m)   Wt 258 lb (117 kg)   BMI 41.64 kg/m    Constitutional:  Alert and oriented, frail-appearing Respiratory: Normal respiratory effort, no increased work of breathing. 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  Moderate sized left hydrocele nontender, 4 to 5 cm in size.  Small right hydrocele.  Assessment & Plan:   In summary, the patient is a comorbidity frail-appearing 73 year old male with 3 UTIs over the last 18 months associated with left-sided orchitis and scrotal tenderness.  He also has simple hydroceles bilaterally, larger on the left.  The hydrocele is not  particularly tender, but the bulky size has interfered with sitting at times.  I again had a long conversation with the patient about treatment options including observation with conservative treatment of snug fitting compression underwear, icing as needed, and anti-inflammatories for any pain versus more invasive therapy with aspiration and drainage in clinic versus hydrocelectomy in the operating room.  He would need to be off of anticoagulation for either of those more invasive treatments, which would increase his risk of TIA or stroke.  We also discussed strategies for prevention of UTIs including adequate hygiene and cranberry prophylaxis.  Finally, I think that his spraying urinary stream is more related to his buried penis and obesity then his hydrocele or BPH.  RTC 2 months for symptom check and PVR  A total of 15 minutes were spent face-to-face with the patient, greater than 50% was spent in patient education, counseling, and coordination of care regarding hydroceles and UTI.   Billey Co, [facility] [address] [place], [place] [address] [phone]

## 2019-07-21 ENCOUNTER — Ambulatory Visit (INDEPENDENT_AMBULATORY_CARE_PROVIDER_SITE_OTHER): Payer: Medicare Other | Admitting: Physician Assistant

## 2019-07-21 ENCOUNTER — Other Ambulatory Visit (INDEPENDENT_AMBULATORY_CARE_PROVIDER_SITE_OTHER): Payer: Medicare Other

## 2019-07-21 ENCOUNTER — Encounter: Payer: Self-pay | Admitting: Physician Assistant

## 2019-07-21 VITALS — BP 120/60 | HR 54 | Temp 98.6°F | Ht 65.0 in | Wt 259.0 lb

## 2019-07-21 DIAGNOSIS — K921 Melena: Secondary | ICD-10-CM

## 2019-07-21 DIAGNOSIS — Z7901 Long term (current) use of anticoagulants: Secondary | ICD-10-CM

## 2019-07-21 DIAGNOSIS — D649 Anemia, unspecified: Secondary | ICD-10-CM | POA: Diagnosis not present

## 2019-07-21 LAB — CBC WITH DIFFERENTIAL/PLATELET
Basophils Absolute: 0.2 10*3/uL — ABNORMAL HIGH (ref 0.0–0.1)
Basophils Relative: 2 % (ref 0.0–3.0)
Eosinophils Absolute: 0.2 10*3/uL (ref 0.0–0.7)
Eosinophils Relative: 2.8 % (ref 0.0–5.0)
HCT: 26.6 % — ABNORMAL LOW (ref 39.0–52.0)
Hemoglobin: 8.8 g/dL — ABNORMAL LOW (ref 13.0–17.0)
Lymphocytes Relative: 18.6 % (ref 12.0–46.0)
Lymphs Abs: 1.4 10*3/uL (ref 0.7–4.0)
MCHC: 33.1 g/dL (ref 30.0–36.0)
MCV: 84.3 fl (ref 78.0–100.0)
Monocytes Absolute: 0.5 10*3/uL (ref 0.1–1.0)
Monocytes Relative: 6.2 % (ref 3.0–12.0)
Neutro Abs: 5.4 10*3/uL (ref 1.4–7.7)
Neutrophils Relative %: 70.4 % (ref 43.0–77.0)
Platelets: 251 10*3/uL (ref 150.0–400.0)
RBC: 3.15 Mil/uL — ABNORMAL LOW (ref 4.22–5.81)
RDW: 14.3 % (ref 11.5–15.5)
WBC: 7.6 10*3/uL (ref 4.0–10.5)

## 2019-07-21 LAB — IBC + FERRITIN
Ferritin: 11.2 ng/mL — ABNORMAL LOW (ref 22.0–322.0)
Iron: 18 ug/dL — ABNORMAL LOW (ref 42–165)
Saturation Ratios: 4.2 % — ABNORMAL LOW (ref 20.0–50.0)
Transferrin: 306 mg/dL (ref 212.0–360.0)

## 2019-07-21 MED ORDER — NA SULFATE-K SULFATE-MG SULF 17.5-3.13-1.6 GM/177ML PO SOLN: 1.0000 | Freq: Once | ORAL | 0 refills | Status: AC

## 2019-07-21 NOTE — Patient Instructions (Addendum)
If you are age 72 or older, your body mass index should be between 23-30. Your Body mass index is 43.1 kg/m. If this is out of the aforementioned range listed, please consider follow up with your Primary Care Provider.  If you are age 56 or younger, your body mass index should be between 19-25. Your Body mass index is 43.1 kg/m. If this is out of the aformentioned range listed, please consider follow up with your Primary Care Provider.   You have been scheduled for an endoscopy/colonoscopy. Please follow written instructions given to you at your visit today. If you use inhalers (even only as needed), please bring them with you on the day of your procedure. Your physician has requested that you go to www.startemmi.com and enter the access code given to you at your visit today. This web site gives a general overview about your procedure. However, you should still follow specific instructions given to you by our office regarding your preparation for the procedure.  Your provider has requested that you go to the basement level for lab work before leaving today. Press "B" on the elevator. The lab is located at the first door on the left as you exit the elevator.  It was a pleasure to see you today!  J.Lemmon, PA-C

## 2019-07-21 NOTE — Progress Notes (Signed)
Assessment and plan reviewed.  For any recurrent bleeding he needs to go to the hospital ASAP.

## 2019-07-21 NOTE — Progress Notes (Signed)
Chief Complaint: Blood in stool  HPI:    Randall [name] is a 73 year old male with a past medical history as listed below including chronic anticoagulation with Plavix, known to Dr. Henrene Pastor for his Barrett's esophagus, who was referred to me by Randall High III, MD for a complaint of blood in stool.      06/19/2015 colonoscopy with 2 polyps in the ascending and transverse colon and moderate diverticulosis in the left colon.  Exam otherwise normal.  Pathology showed tubular adenoma.  Repeat recommended in 5 years.    08/24/2015 EGD for history of Barrett's esophagus on EGD elsewhere in 2014, no Barrett's esophagus at that time, partial distal esophageal ring, post gastric bypass anatomy, GERD by history no repeat was recommended.      06/12/2016 EGD at [facility] ( I can not see report in computer).    07/18/2019 patient was seen by PCP for blood in bowel movements.  He was discussed he had a history of perforated marginal ulcer requiring surgical repair.  He had a bowel movement 07/12/2019 with reddish material in the stool.  He continued to see reddish material for the next 2 days and described pink tingeing in the toilet.  Denied rectal pain or abdominal pain, nausea or vomiting.  At time of visit he reports stool was "more dark in color".  Patient reported he had not missed any of his doses of pantoprazole 40 mg daily rectal exam showed nonthrombosed external hemorrhoid, slightly reduced tone, brown stool in the vault without blood or melena.  Patient had Hemoccult cards sent which were positive.  Given Anusol.    Patient brings labs with.  Last hemoglobin 04/26/2019 was 11.5, repeat hemoglobin 07/18/2019 was 8.7.  They just returned Hemoccult studies today so unsure of results.    Today, the patient explains that 2 weeks ago today he started with bright red blood and pink-tinged toilet water when he had a bowel movement.  He had 2-3 bowel movements a day for the next 6 or 7 days, they then turned dark "chocolate  brown".  As of Monday, 08/17/2019 he has seen no further blood in his gone back to his normal solid brown stool once a day.  Went to see PCP as above with labs as above.  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    Does admit to using hemp oil on a nightly basis for 2 weeks prior to bleed.  His wife read some more online he should not use hemp oil while on a blood thinner.    Denies fever, chills, weight loss, change in bowel habits, abdominal pain, heartburn, reflux, nausea or vomiting.  Past Medical History:  Diagnosis Date  . Arthritis   . GERD (gastroesophageal reflux disease)   . Gout   . Hypertension   . Melanoma ([place]) 2014   forehead  . Sleep apnea    cpap    Past Surgical History:  Procedure Laterality Date  . BACK SURGERY  2005   spinal stenosis  . barett's esophagus  2014  . COLONOSCOPY    . GASTRIC BYPASS  2014  . POLYPECTOMY    . removal melanoma  2014   forehead  . SHOULDER ARTHROSCOPY W/ ROTATOR CUFF REPAIR Right    x2  . TOTAL KNEE ARTHROPLASTY Left 2005  . TOTAL KNEE ARTHROPLASTY Right 2016  . UPPER GASTROINTESTINAL ENDOSCOPY      Current Outpatient Medications  Medication Sig Dispense Refill  . allopurinol (ZYLOPRIM) 300 MG  tablet Take 300 mg by mouth daily.    . carvedilol (COREG) 12.5 MG tablet Take 12.5 mg by mouth 2 (two) times daily.    . Cholecalciferol (VITAMIN D3) 5000 UNITS TABS Take 5,000 Units by mouth daily.     . clopidogrel (PLAVIX) 75 MG tablet Take 75 mg by mouth daily.    . Cyanocobalamin (VITAMIN B-12) 2500 MCG SUBL Place 2,500 mcg under the tongue daily.     Marland Kitchen desonide (DESOWEN) 0.05 % cream Apply to itchy areas twice daily when flaring    . diclofenac sodium (VOLTAREN) 1 % GEL     . diphenhydramine-acetaminophen (TYLENOL PM) 25-500 MG TABS Take 1 tablet by mouth at bedtime as needed (sleep).     Marland Kitchen doxazosin (CARDURA) 8 MG tablet Take 8 mg by mouth at bedtime.     . flintstones complete (FLINTSTONES) 60 MG chewable tablet Chew 1 tablet  by mouth daily.     . hydrALAZINE (APRESOLINE) 100 MG tablet Take 100 mg by mouth 2 (two) times daily.    . hydrochlorothiazide (HYDRODIURIL) 25 MG tablet     . ketoconazole (NIZORAL) 2 % shampoo     . omeprazole (PRILOSEC) 40 MG capsule omeprazole 40 mg capsule,delayed release    . oxymetazoline (AFRIN) 0.05 % nasal spray Place 1 spray into both nostrils 2 (two) times daily.    . pantoprazole (PROTONIX) 40 MG tablet Take 40 mg by mouth daily.    . propranolol ER (INDERAL LA) 80 MG 24 hr capsule Take 80 mg by mouth daily.    Marland Kitchen telmisartan (MICARDIS) 80 MG tablet Take 80 mg by mouth daily.    . traMADol (ULTRAM) 50 MG tablet Take 50 mg by mouth every 6 (six) hours as needed for pain.    Marland Kitchen triamcinolone (KENALOG) 0.025 % cream triamcinolone acetonide 0.025 % topical cream     No current facility-administered medications for this visit.     Allergies as of 07/21/2019 - Review Complete 06/20/2019  Allergen Reaction Noted  . Amlodipine Other (See Comments) 06/08/2015  . Nsaids Other (See Comments) 06/08/2015  . Tolmetin Other (See Comments) 06/08/2015    Family History  Problem Relation Age of Onset  . Prostate cancer Father   . Endometrial cancer Mother   . Colon cancer Neg Hx   . Esophageal cancer Neg Hx   . Rectal cancer Neg Hx   . Stomach cancer Neg Hx     Social History   Socioeconomic History  . Marital status: Married    Spouse name: Not on file  . Number of children: Not on file  . Years of education: Not on file  . Highest education level: Not on file  Occupational History  . Not on file  Social Needs  . Financial resource strain: Not on file  . Food insecurity    Worry: Not on file    Inability: Not on file  . Transportation needs    Medical: Not on file    Non-medical: Not on file  Tobacco Use  . Smoking status: Never Smoker  . Smokeless tobacco: Current User    Types: Snuff  Substance and Sexual Activity  . Alcohol use: Yes    Alcohol/week: 0.0 standard  drinks    Comment: 1 glass wine a month  . Drug use: No  . Sexual activity: Not Currently  Lifestyle  . Physical activity    Days per week: Not on file    Minutes per session: Not on file  .  Stress: Not on file  Relationships  . Social Herbalist on phone: Not on file    Gets together: Not on file    Attends religious service: Not on file    Active member of club or organization: Not on file    Attends meetings of clubs or organizations: Not on file    Relationship status: Not on file  . Intimate partner violence    Fear of current or ex partner: Not on file    Emotionally abused: Not on file    Physically abused: Not on file    Forced sexual activity: Not on file  Other Topics Concern  . Not on file  Social History Narrative  . Not on file    Review of Systems:    Constitutional: No weight loss, fever or chills Skin: No rash  Cardiovascular: No chest pain Respiratory: No SOB  Gastrointestinal: See HPI and otherwise negative Genitourinary: No dysuria Neurological: No headache, dizziness or syncope Musculoskeletal: No new muscle or joint pain Hematologic: No bruising Psychiatric: No history of depression or anxiety   Physical Exam:  Vital signs: BP 120/60   Pulse (!) 54   Temp 98.6 F (37 C)   Ht 5\' 5"  (1.651 m)   Wt 259 lb (117.5 kg)   BMI 43.10 kg/m   Constitutional:   Pleasant overweight Caucasian male appears to be in NAD, Well developed, Well nourished, alert and cooperative Head:  Normocephalic and atraumatic. Eyes:   PEERL, EOMI. No icterus. Conjunctiva pink. Ears:  Normal auditory acuity. Neck:  Supple Throat: Oral cavity and pharynx without inflammation, swelling or lesion.  Respiratory: Respirations even and unlabored. Lungs clear to auscultation bilaterally.   No wheezes, crackles, or rhonchi.  Cardiovascular: Normal S1, S2. No MRG. Regular rate and rhythm. No peripheral edema, cyanosis or pallor.  Gastrointestinal:  Soft, nondistended,  nontender. No rebound or guarding. Normal bowel sounds. No appreciable masses or hepatomegaly. Rectal:  Not performed.  Msk:  Symmetrical without gross deformities. Without edema, no deformity or joint abnormality.  Neurologic:  Alert and  oriented x4;  grossly normal neurologically.  Skin:   Dry and intact without significant lesions or rashes. Psychiatric:  Demonstrates good judgement and reason without abnormal affect or behaviors.  See HPI for recent labs.  Assessment: 1.  GI bleed: Described over a 7 to 10-day period, initially bright red blood/pink-tinged water then turning to what sounds like melena, over the past 4 days no further bleeding 2.  Anemia: Hemoglobin from 11.5 on 7/7 down to 8.7 on 9/28 after described bleeding as above; concern for GI source of blood loss 3.  Chronic anticoagulation: With Plavix status post DVT  Plan: 1.  Scheduled patient for an EGD and Colonoscopy in the [place] with Dr. Henrene Pastor.  Did discuss risks, benefits, limitations and alternatives the patient agrees to proceed. 2.  Patient was advised to hold his Plavix for 5 days prior to time procedure.  We will communicate with his prescribing physician to ensure this is acceptable for him. 3.  Repeat CBC and iron studies including ferritin today 4.  Patient to follow in clinic per recommendations from Dr. Henrene Pastor after time of procedures.  Randall Newer, PA-C [facility] 07/21/2019, 2:30 PM  Cc: Adin Hector, MD

## 2019-07-22 ENCOUNTER — Telehealth: Payer: Self-pay

## 2019-07-22 NOTE — Telephone Encounter (Signed)
Faxed received from Dr Aquilla Hacker office. Ok to hold Plavix 5 days prior to colonoscopy. Fax has been sent to be scanned.   Left message on patients voicemail and his wifes cell phone to call for Cardiac instructions

## 2019-07-22 NOTE — Telephone Encounter (Signed)
Patient has been notified and aware to hold Plavix. And he states clear understanding and has no further questions at this time.

## 2019-07-22 NOTE — Telephone Encounter (Signed)
Request to hold Plavix was faxed to Dr Alvy Bimler office in [place] yesterday( 07-21-19 ). I called this AM to expedite the request as the pt needs to know at the endo of the day in order to start holding on Monday.

## 2019-07-22 NOTE — Telephone Encounter (Signed)
Pt called stating returning your call

## 2019-07-28 ENCOUNTER — Telehealth: Payer: Self-pay

## 2019-07-28 NOTE — Telephone Encounter (Signed)
Covid-19 screening questions   Do you now or have you had a fever in the last 14 days?  Do you have any respiratory symptoms of shortness of breath or cough now or in the last 14 days?  Do you have any family members or close contacts with diagnosed or suspected Covid-19 in the past 14 days?  Have you been tested for Covid-19 and found to be positive?       

## 2019-07-29 ENCOUNTER — Ambulatory Visit (AMBULATORY_SURGERY_CENTER): Payer: Medicare Other | Admitting: Internal Medicine

## 2019-07-29 ENCOUNTER — Encounter: Payer: Self-pay | Admitting: Internal Medicine

## 2019-07-29 ENCOUNTER — Other Ambulatory Visit: Payer: Self-pay

## 2019-07-29 VITALS — BP 155/76 | HR 52 | Temp 98.8°F | Resp 16 | Ht 65.0 in | Wt 259.0 lb

## 2019-07-29 DIAGNOSIS — K922 Gastrointestinal hemorrhage, unspecified: Secondary | ICD-10-CM | POA: Diagnosis not present

## 2019-07-29 DIAGNOSIS — K921 Melena: Secondary | ICD-10-CM | POA: Diagnosis not present

## 2019-07-29 DIAGNOSIS — K5730 Diverticulosis of large intestine without perforation or abscess without bleeding: Secondary | ICD-10-CM

## 2019-07-29 DIAGNOSIS — D62 Acute posthemorrhagic anemia: Secondary | ICD-10-CM

## 2019-07-29 DIAGNOSIS — D649 Anemia, unspecified: Secondary | ICD-10-CM

## 2019-07-29 DIAGNOSIS — K259 Gastric ulcer, unspecified as acute or chronic, without hemorrhage or perforation: Secondary | ICD-10-CM | POA: Diagnosis not present

## 2019-07-29 DIAGNOSIS — Z86010 Personal history of colonic polyps: Secondary | ICD-10-CM

## 2019-07-29 DIAGNOSIS — K250 Acute gastric ulcer with hemorrhage: Secondary | ICD-10-CM

## 2019-07-29 HISTORY — PX: COLONOSCOPY: SHX174

## 2019-07-29 MED ORDER — SODIUM CHLORIDE 0.9 % IV SOLN: 500.0000 mL | Freq: Once | INTRAVENOUS | Status: DC

## 2019-07-29 MED ORDER — SUCRALFATE 1 GM/10ML PO SUSP: 1.0000 g | Freq: Two times a day (BID) | ORAL | 1 refills | Status: DC

## 2019-07-29 MED ORDER — OMEPRAZOLE 40 MG PO CPDR: 40.0000 mg | DELAYED_RELEASE_CAPSULE | Freq: Two times a day (BID) | ORAL | 11 refills | Status: DC

## 2019-07-29 NOTE — Progress Notes (Signed)
Pt's states no medical or surgical changes since previsit or office visit.Vitals-Nancy C. Temp-Lisa

## 2019-07-29 NOTE — Patient Instructions (Addendum)
Impression/Recommendations:  Diverticulosis handout given to patient.  Hold Plavix for 1 week then resume. Omeprazole 40 mg. 2 times daily.Avoid NSAIDs such as Ibuprofen, Advil, Motrin, Aleve, aspirin, and other medications that cause ulcers. Carafate slurry 1 g.  Take twice daily.  Do not take within 2 hours of other medications.  Repeat Endoscopy to assess for ulcer healing in 6 weeks.  Resume previous diet. Continue present medications.  YOU HAD AN ENDOSCOPIC PROCEDURE TODAY AT [facility]:   Refer to the procedure report that was given to you for any specific questions about what was found during the examination.  If the procedure report does not answer your questions, please call your gastroenterologist to clarify.  If you requested that your care partner not be given the details of your procedure findings, then the procedure report has been included in a sealed envelope for you to review at your convenience later.  YOU SHOULD EXPECT: Some feelings of bloating in the abdomen. Passage of more gas than usual.  Walking can help get rid of the air that was put into your GI tract during the procedure and reduce the bloating. If you had a lower endoscopy (such as a colonoscopy or flexible sigmoidoscopy) you may notice spotting of blood in your stool or on the toilet paper. If you underwent a bowel prep for your procedure, you may not have a normal bowel movement for a few days.  Please Note:  You might notice some irritation and congestion in your nose or some drainage.  This is from the oxygen used during your procedure.  There is no need for concern and it should clear up in a day or so.  SYMPTOMS TO REPORT IMMEDIATELY:   Following lower endoscopy (colonoscopy or flexible sigmoidoscopy):  Excessive amounts of blood in the stool  Significant tenderness or worsening of abdominal pains  Swelling of the abdomen that is new, acute  Fever of 100F or higher   Following upper  endoscopy (EGD)  Vomiting of blood or coffee ground material  New chest pain or pain under the shoulder blades  Painful or persistently difficult swallowing  New shortness of breath  Fever of 100F or higher  Black, tarry-looking stools  For urgent or emergent issues, a gastroenterologist can be reached at any hour by calling [phone].   DIET:  We do recommend a small meal at first, but then you may proceed to your regular diet.  Drink plenty of fluids but you should avoid alcoholic beverages for 24 hours.  ACTIVITY:  You should plan to take it easy for the rest of today and you should NOT DRIVE or use heavy machinery until tomorrow (because of the sedation medicines used during the test).    FOLLOW UP: Our staff will call the number listed on your records 48-72 hours following your procedure to check on you and address any questions or concerns that you may have regarding the information given to you following your procedure. If we do not reach you, we will leave a message.  We will attempt to reach you two times.  During this call, we will ask if you have developed any symptoms of COVID 19. If you develop any symptoms (ie: fever, flu-like symptoms, shortness of breath, cough etc.) before then, please call [phone].  If you test positive for Covid 19 in the 2 weeks post procedure, please call and report this information to [place].    If any biopsies were taken you will be  contacted by phone or by letter within the next 1-3 weeks.  Please call us at [phone] if you have not heard about the biopsies in 3 weeks.    SIGNATURES/CONFIDENTIALITY: You and/or your care partner have signed paperwork which will be entered into your electronic medical record.  These signatures attest to the fact that that the information above on your After Visit Summary has been reviewed and is understood.  Full responsibility of the confidentiality of this discharge information lies with you and/or your  care-partner.

## 2019-07-29 NOTE — Op Note (Signed)
[place] [facility] [place] Patient Name: Randall [name] Procedure Date: 07/29/2019 3:19 PM MRN: [id number] Endoscopist: Docia Chuck. Henrene Pastor , MD Age: 73 Referring MD:  Date of Birth: 05/29/1946 Gender: Male Account #: [id number] Procedure:                Colonoscopy Indications:              Hematochezia, Melena, Acute post hemorrhagic                            anemia. The patient has a history of small sessile                            serrated polyps without cytologic dysplasia and                            nonadvanced adenomas. Previous colonoscopy 2004,                            2009, 2016 Medicines:                Monitored Anesthesia Care Procedure:                Pre-Anesthesia Assessment:                           - Prior to the procedure, a History and Physical                            was performed, and patient medications and                            allergies were reviewed. The patient's tolerance of                            previous anesthesia was also reviewed. The risks                            and benefits of the procedure and the sedation                            options and risks were discussed with the patient.                            All questions were answered, and informed consent                            was obtained. Prior Anticoagulants: The patient has                            taken Plavix (clopidogrel), last dose was 6 days                            prior to procedure. ASA Grade Assessment: III - A  patient with severe systemic disease. After                            reviewing the risks and benefits, the patient was                            deemed in satisfactory condition to undergo the                            procedure.                           After obtaining informed consent, the colonoscope                            was passed under direct vision. Throughout the                            procedure, the  patient's blood pressure, pulse, and                            oxygen saturations were monitored continuously. The                            Colonoscope was introduced through the anus and                            advanced to the the cecum, identified by                            appendiceal orifice and ileocecal valve. The                            ileocecal valve, appendiceal orifice, and rectum                            were photographed. The quality of the bowel                            preparation was excellent. The colonoscopy was                            performed without difficulty. The patient tolerated                            the procedure well. The bowel preparation used was                            SUPREP via split dose instruction. Scope In: 3:25:02 PM Scope Out: 3:35:07 PM Scope Withdrawal Time: 0 hours 7 minutes 5 seconds  Total Procedure Duration: 0 hours 10 minutes 5 seconds  Findings:                 A few medium-mouthed diverticula were found in the  left colon.                           The exam was otherwise without abnormality on                            direct and retroflexion views. Complications:            No immediate complications. Estimated blood loss:                            None. Estimated Blood Loss:     Estimated blood loss: none. Impression:               - Diverticulosis in the left colon.                           - The examination was otherwise normal on direct                            and retroflexion views.                           - No specimens collected. Recommendation:           - Repeat colonoscopy is not recommended for                            surveillance.                           - Resume Plavix (clopidogrel) in 1 week at prior                            dose.                           - Patient has a contact number available for                            emergencies. The signs and  symptoms of potential                            delayed complications were discussed with the                            patient. Return to normal activities tomorrow.                            Written discharge instructions were provided to the                            patient.                           - Resume previous diet.                           -  Continue present medications.                           -Please see EGD report regarding findings and final                            recommendations Sabeen Piechocki N. Henrene Pastor, MD 07/29/2019 3:51:58 PM This report has been signed electronically.

## 2019-07-29 NOTE — Op Note (Signed)
[name] Procedure Date: 07/29/2019 3:18 PM MRN: [id number] Endoscopist: Docia Chuck. Henrene Pastor , MD Age: 73 Referring MD:  Date of Birth: 31-May-1946 Gender: Male Account #: [id number] Procedure:                Upper GI endoscopy Indications:              Melena Medicines:                Monitored Anesthesia Care Procedure:                Pre-Anesthesia Assessment:                           - Prior to the procedure, a History and Physical                            was performed, and patient medications and                            allergies were reviewed. The patient's tolerance of                            previous anesthesia was also reviewed. The risks                            and benefits of the procedure and the sedation                            options and risks were discussed with the patient.                            All questions were answered, and informed consent                            was obtained. Prior Anticoagulants: The patient has                            taken Plavix (clopidogrel), last dose was 6 days                            prior to procedure. ASA Grade Assessment: III - A                            patient with severe systemic disease. After                            reviewing the risks and benefits, the patient was                            deemed in satisfactory condition to undergo the                            procedure.  After obtaining informed consent, the endoscope was                            passed under direct vision. Throughout the                            procedure, the patient's blood pressure, pulse, and                            oxygen saturations were monitored continuously. The                            Endoscope was introduced through the mouth, and                            advanced to the jejunum. The upper GI endoscopy was   accomplished without difficulty. The patient                            tolerated the procedure well. Scope In: Scope Out: Findings:                 The esophagus was normal.                           The stomach revealed evidence of prior Roux-en-Y                            gastric bypass anatomy.                           One non-bleeding cratered gastric ulcer was found                            at the anastomosis, on the small bowel side. The                            lesion was 8 mm in largest dimension and cratered                            with friability but clean base. The jejunum beyond                            was normal. Complications:            No immediate complications. Estimated Blood Loss:     Estimated blood loss: none. Impression:               1. Status post Roux-en-Y gastric bypass surgery                           2. Anastomotic ulcer on the small bowel side Recommendation:           1. Hold Plavix for 1 week then resume                           2.  Prescribe omeprazole capsules 40 mg twice daily;                            #60; 11 refills; open the contents of the capsule                            into applesauce and take twice daily.                           3. Avoid NSAIDs such as ibuprofen, Advil, Motrin,                            Aleve, aspirin, and other similar entities as these                            cause ulcers                           4. Prescribe Carafate slurry 1 g; dispense 6 months                            worth; take 1 g twice daily. Do not take within 2                            hours of other medications                           5. Repeat EGD in [place] to assess for ulcer healing in                            6 weeks. Docia Chuck. Henrene Pastor, MD 07/29/2019 3:58:51 PM This report has been signed electronically.

## 2019-07-29 NOTE — Progress Notes (Signed)
PT taken to PACU. Monitors in place. VSS. Report given to RN. 

## 2019-08-02 ENCOUNTER — Telehealth: Payer: Self-pay

## 2019-08-02 NOTE — Telephone Encounter (Signed)
  Follow up Call-  Call back number 07/29/2019  Post procedure Call Back phone  # [phone]  Permission to leave phone message Yes  Some recent data might be hidden     Patient questions:  Do you have a fever, pain , or abdominal swelling? No. Pain Score  0 *  Have you tolerated food without any problems? Yes.    Have you been able to return to your normal activities? Yes.    Do you have any questions about your discharge instructions: Diet   No. Medications  No. Follow up visit  No.  Do you have questions or concerns about your Care? No.  Actions: * If pain score is 4 or above: No action needed, pain <4.  1. Have you developed a fever since your procedure? no  2.   Have you had an respiratory symptoms (SOB or cough) since your procedure? no  3.   Have you tested positive for COVID 19 since your procedure no  4.   Have you had any family members/close contacts diagnosed with the COVID 19 since your procedure?  no   If yes to any of these questions please route to Joylene John, RN and Alphonsa Gin, Therapist, sports.

## 2019-08-02 NOTE — Telephone Encounter (Signed)
Lm on vm 

## 2019-08-25 ENCOUNTER — Other Ambulatory Visit: Payer: Self-pay | Admitting: Internal Medicine

## 2019-08-30 ENCOUNTER — Ambulatory Visit (AMBULATORY_SURGERY_CENTER): Payer: Self-pay

## 2019-08-30 ENCOUNTER — Other Ambulatory Visit: Payer: Self-pay

## 2019-08-30 VITALS — Temp 97.5°F | Ht 65.0 in | Wt 261.0 lb

## 2019-08-30 DIAGNOSIS — K253 Acute gastric ulcer without hemorrhage or perforation: Secondary | ICD-10-CM

## 2019-08-30 NOTE — Progress Notes (Signed)
Denies allergies to eggs or soy products. Denies complication of anesthesia or sedation. Denies use of weight loss medication. Denies use of O2.   Emmi instructions given for colonoscopy.   Covid screening is scheduled for Monday 09/05/19 @ 1:30 Pm.

## 2019-09-01 ENCOUNTER — Ambulatory Visit (INDEPENDENT_AMBULATORY_CARE_PROVIDER_SITE_OTHER): Payer: Medicare Other | Admitting: Urology

## 2019-09-01 ENCOUNTER — Encounter: Payer: Self-pay | Admitting: Urology

## 2019-09-01 ENCOUNTER — Other Ambulatory Visit: Payer: Self-pay

## 2019-09-01 VITALS — BP 130/60 | HR 57 | Ht 66.0 in | Wt 258.0 lb

## 2019-09-01 DIAGNOSIS — N390 Urinary tract infection, site not specified: Secondary | ICD-10-CM

## 2019-09-01 LAB — URINALYSIS, COMPLETE
Bilirubin, UA: NEGATIVE
Glucose, UA: NEGATIVE
Ketones, UA: NEGATIVE
Leukocytes,UA: NEGATIVE
Nitrite, UA: NEGATIVE
Protein,UA: NEGATIVE
RBC, UA: NEGATIVE
Specific Gravity, UA: 1.025 (ref 1.005–1.030)
Urobilinogen, Ur: 0.2 mg/dL (ref 0.2–1.0)
pH, UA: 5.5 (ref 5.0–7.5)

## 2019-09-01 LAB — MICROSCOPIC EXAMINATION
Bacteria, UA: NONE SEEN
RBC, Urine: NONE SEEN /hpf (ref 0–2)

## 2019-09-01 LAB — BLADDER SCAN AMB NON-IMAGING

## 2019-09-01 NOTE — Progress Notes (Signed)
   09/01/2019 11:01 AM   Randall [name] [birth year] [id number]  Reason for visit: Follow up UTIs, hydroceles  HPI: I saw Randall [name] back in clinic today for follow-up of hydrocele and recurrent UTIs.  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  We have been managing these conservatively, as they are minimally bothersome, not painful, and he has a number of comorbidities and anticoagulation.  He denies any infections since June 2020.  His hydroceles are minimally bothersome and nonpainful.  Overall, he feels he is doing well.  He denies any gross hematuria or dysuria.  His urinary symptoms are stable and minimally bothersome.  His primary issue is occasional frequency during the day.  He denies any significant nocturia.  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  PVR 17 mL.  ROS: Please see flowsheet from today's date for complete review of systems.  Physical Exam: BP 130/60   Pulse (!) 57   Ht 5\' 6"  (1.676 m)   Wt 258 lb (117 kg)   BMI 41.64 kg/m    Constitutional:  Alert and oriented, No acute distress. Respiratory: Normal respiratory effort, no increased work of breathing. GI: Abdomen is soft, nontender, nondistended, no abdominal masses GU: Moderate hydroceles bilaterally, left greater than right, nontender, no erythema Skin: No rashes, bruises or suspicious lesions. Neurologic: Grossly intact, no focal deficits, moving all 4 extremities. 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  His hydroceles are not bothering him lately, and he is in agreement to continue observation at this time.  We discussed nonfitting underwear, icing and NSAIDs as needed, and elevation.  I recommended  cranberry prophylaxis for his history of infections, though he has been doing well.  We also discussed behavioral strategies regarding his frequency including avoiding sodas, diet drinks, and tea, and double voiding prior to bed.  RTC 1 year with symptom check with PA If doing well at that time likely change to follow-up as needed  A total of 25 minutes were spent face-to-face with the patient, greater than 50% was spent in patient education, counseling, and coordination of care regarding recurrent UTIs and hydroceles.  Billey Co, [facility] [address], [place], [place] [address] [phone]

## 2019-09-01 NOTE — Patient Instructions (Signed)
1. Take cranberry tablets twice daily to help prevent urinary infections   Hydrocele, Adult A hydrocele is a collection of fluid in the loose pouch of skin that holds the testicles (scrotum). This may happen because:  The amount of fluid produced in the scrotum is not absorbed by the rest of the body.  Fluid from the abdomen fills the scrotum. Normally, the testicles develop in the abdomen then move (drop) into to the scrotum before birth. The tube that the testicles travel through usually closes after the testicles drop. If the tube does not close, fluid from the abdomen can fill the scrotum. This is less common in adults. What are the causes? The cause of a hydrocele in adults is usually not known. However, it may be caused by:  An injury to the scrotum.  An infection (epididymitis).  Decreased blood flow to the scrotum.  Twisting of a testicle (testicular torsion).  A birth defect.  A tumor or cancer of the testicle. What are the signs or symptoms? A hydrocele feels like a water-filled balloon. It may also feel heavy. Other symptoms include:  Swelling of the scrotum. The swelling may decrease when you lie down. You may also notice more swelling at night than in the morning.  Swelling of the groin.  Mild discomfort in the scrotum.  Pain. This can develop if the hydrocele was caused by infection or twisting. The larger the hydrocele, the more likely you are to have pain. How is this diagnosed? This condition may be diagnosed based on:  Physical exam.  Medical history. You may also have other tests, including:  Imaging tests, such as ultrasound.  Blood or urine tests. How is this treated? Most hydroceles go away on their own. If you have no discomfort or pain, your health care provider may suggest close monitoring of your condition (called watch and wait or watchful waiting) until the condition goes away or symptoms develop. If treatment is needed, it may include:   Treating an underlying condition. This may include using an antibiotic medicine to treat an infection.  Surgery to stop fluid from collecting in the scrotum.  Surgery to drain the fluid. Options include: ? Needle aspiration. A needle is used to drain fluid. However, the fluid buildup will come back quickly. ? Hydrocelectomy. For this procedure, an incision is made in the scrotum to remove the fluid sac. Follow these instructions at home:  Watch the hydrocele for any changes.  Take over-the-counter and prescription medicines only as told by your health care provider.  If you were prescribed an antibiotic medicine, use it as told by your health care provider. Do not stop taking the antibiotic even if you start to feel better.  Keep all follow-up visits as told by your health care provider. This is important. Contact a health care provider if:  You notice any changes in the hydrocele.  The swelling in your scrotum or groin gets worse.  The hydrocele becomes red, firm, painful, or tender to the touch.  You have a fever. Get help right away if you:  Develop a lot of pain, or your pain becomes worse. Summary  A hydrocele is a collection of fluid in the loose pouch of skin that holds the testicles (scrotum).  Hydroceles can cause swelling, discomfort, and sometimes pain.  In adults, the cause of a hydrocele usually is not known. However, it is sometimes caused by an infection or a rotation and twisting of the scrotum.  Treatment is usually not needed.  Hydroceles often go away on their own. If a hydrocele causes pain, treatment may be given to ease the pain. This information is not intended to replace advice given to you by your health care provider. Make sure you discuss any questions you have with your health care provider. Document Released: 03/26/2010 Document Revised: 10/17/2017 Document Reviewed: 10/17/2017 Elsevier Patient Education  2020 [employer].

## 2019-09-05 ENCOUNTER — Ambulatory Visit (INDEPENDENT_AMBULATORY_CARE_PROVIDER_SITE_OTHER): Payer: Medicare Other

## 2019-09-05 DIAGNOSIS — Z1159 Encounter for screening for other viral diseases: Secondary | ICD-10-CM

## 2019-09-06 LAB — SARS CORONAVIRUS 2 (TAT 6-24 HRS): SARS Coronavirus 2: NEGATIVE

## 2019-09-08 ENCOUNTER — Ambulatory Visit (AMBULATORY_SURGERY_CENTER): Payer: Medicare Other | Admitting: Internal Medicine

## 2019-09-08 ENCOUNTER — Other Ambulatory Visit: Payer: Self-pay | Admitting: Internal Medicine

## 2019-09-08 ENCOUNTER — Other Ambulatory Visit: Payer: Self-pay

## 2019-09-08 ENCOUNTER — Other Ambulatory Visit (INDEPENDENT_AMBULATORY_CARE_PROVIDER_SITE_OTHER): Payer: Medicare Other

## 2019-09-08 ENCOUNTER — Encounter: Payer: Self-pay | Admitting: Internal Medicine

## 2019-09-08 VITALS — BP 168/76 | HR 57 | Temp 98.4°F | Resp 16 | Ht 65.0 in | Wt 261.0 lb

## 2019-09-08 DIAGNOSIS — K253 Acute gastric ulcer without hemorrhage or perforation: Secondary | ICD-10-CM

## 2019-09-08 DIAGNOSIS — K259 Gastric ulcer, unspecified as acute or chronic, without hemorrhage or perforation: Secondary | ICD-10-CM

## 2019-09-08 HISTORY — PX: UPPER GASTROINTESTINAL ENDOSCOPY: SHX188

## 2019-09-08 LAB — CBC WITH DIFFERENTIAL/PLATELET
Basophils Absolute: 0.2 10*3/uL — ABNORMAL HIGH (ref 0.0–0.1)
Basophils Relative: 3.2 % — ABNORMAL HIGH (ref 0.0–3.0)
Eosinophils Absolute: 0.2 10*3/uL (ref 0.0–0.7)
Eosinophils Relative: 3 % (ref 0.0–5.0)
HCT: 36.8 % — ABNORMAL LOW (ref 39.0–52.0)
Hemoglobin: 11.8 g/dL — ABNORMAL LOW (ref 13.0–17.0)
Lymphocytes Relative: 22.8 % (ref 12.0–46.0)
Lymphs Abs: 1.4 10*3/uL (ref 0.7–4.0)
MCHC: 32.1 g/dL (ref 30.0–36.0)
MCV: 82.7 fl (ref 78.0–100.0)
Monocytes Absolute: 0.3 10*3/uL (ref 0.1–1.0)
Monocytes Relative: 4.7 % (ref 3.0–12.0)
Neutro Abs: 3.9 10*3/uL (ref 1.4–7.7)
Neutrophils Relative %: 66.3 % (ref 43.0–77.0)
Platelets: 237 10*3/uL (ref 150.0–400.0)
RBC: 4.45 Mil/uL (ref 4.22–5.81)
RDW: 13.8 % (ref 11.5–15.5)
WBC: 5.9 10*3/uL (ref 4.0–10.5)

## 2019-09-08 LAB — FERRITIN: Ferritin: 14.6 ng/mL — ABNORMAL LOW (ref 22.0–322.0)

## 2019-09-08 MED ORDER — FERROUS SULFATE 325 (65 FE) MG PO TBEC: 325.0000 mg | DELAYED_RELEASE_TABLET | Freq: Two times a day (BID) | ORAL | 3 refills | Status: DC

## 2019-09-08 MED ORDER — SODIUM CHLORIDE 0.9 % IV SOLN: 500.0000 mL | INTRAVENOUS | Status: DC

## 2019-09-08 MED ORDER — OMEPRAZOLE 40 MG PO CPDR: 40.0000 mg | DELAYED_RELEASE_CAPSULE | Freq: Two times a day (BID) | ORAL | 11 refills | Status: DC

## 2019-09-08 NOTE — Progress Notes (Signed)
I have received a call from Lynn Ito, RN in [facility] indicating that Dr Henrene Pastor would like patient to have a CBC and ferritin completed today.  Dx: gastric ulcer, K25.9  Orders have been placed.

## 2019-09-08 NOTE — Progress Notes (Signed)
Report to PACU, RN, vss, BBS= Clear.  

## 2019-09-08 NOTE — Patient Instructions (Signed)
Impression/Recommendations:  Omeprazole 40 mg. Capsules.  Open the capsule onto applesauce.  Take 1 capsule twice daily. Continue Carafate (sulcralfate). Avoid all unnecessary NSAIDs.  Resume Plavix today.  CBC and ferritin level in lab today.  Schedule EGD in 4-6 weeks to reassess ulcer.  YOU HAD AN ENDOSCOPIC PROCEDURE TODAY AT [facility]:   Refer to the procedure report that was given to you for any specific questions about what was found during the examination.  If the procedure report does not answer your questions, please call your gastroenterologist to clarify.  If you requested that your care partner not be given the details of your procedure findings, then the procedure report has been included in a sealed envelope for you to review at your convenience later.  YOU SHOULD EXPECT: Some feelings of bloating in the abdomen. Passage of more gas than usual.  Walking can help get rid of the air that was put into your GI tract during the procedure and reduce the bloating. If you had a lower endoscopy (such as a colonoscopy or flexible sigmoidoscopy) you may notice spotting of blood in your stool or on the toilet paper. If you underwent a bowel prep for your procedure, you may not have a normal bowel movement for a few days.  Please Note:  You might notice some irritation and congestion in your nose or some drainage.  This is from the oxygen used during your procedure.  There is no need for concern and it should clear up in a day or so.  SYMPTOMS TO REPORT IMMEDIATELY:   Following upper endoscopy (EGD)  Vomiting of blood or coffee ground material  New chest pain or pain under the shoulder blades  Painful or persistently difficult swallowing  New shortness of breath  Fever of 100F or higher  Black, tarry-looking stools  For urgent or emergent issues, a gastroenterologist can be reached at any hour by calling [phone].   DIET:  We do recommend a small meal at  first, but then you may proceed to your regular diet.  Drink plenty of fluids but you should avoid alcoholic beverages for 24 hours.  ACTIVITY:  You should plan to take it easy for the rest of today and you should NOT DRIVE or use heavy machinery until tomorrow (because of the sedation medicines used during the test).    FOLLOW UP: Our staff will call the number listed on your records 48-72 hours following your procedure to check on you and address any questions or concerns that you may have regarding the information given to you following your procedure. If we do not reach you, we will leave a message.  We will attempt to reach you two times.  During this call, we will ask if you have developed any symptoms of COVID 19. If you develop any symptoms (ie: fever, flu-like symptoms, shortness of breath, cough etc.) before then, please call [phone].  If you test positive for Covid 19 in the 2 weeks post procedure, please call and report this information to [place].    If any biopsies were taken you will be contacted by phone or by letter within the next 1-3 weeks.  Please call us at [phone] if you have not heard about the biopsies in 3 weeks.    SIGNATURES/CONFIDENTIALITY: You and/or your care partner have signed paperwork which will be entered into your electronic medical record.  These signatures attest to the fact that that the information above on your After  Visit Summary has been reviewed and is understood.  Full responsibility of the confidentiality of this discharge information lies with you and/or your care-partner. 

## 2019-09-08 NOTE — Progress Notes (Signed)
Per Dr. Henrene Pastor, pt does not need to stop Plavix for his next EGD on 10-13-19

## 2019-09-08 NOTE — Progress Notes (Signed)
Temp LC V/s [place] I have reviewed the patient's medical history in detail and updated the computerized patient record.

## 2019-09-08 NOTE — Op Note (Addendum)
[place] Patient Name: Randall [name] Procedure Date: 09/08/2019 10:24 AM MRN: [id number] Endoscopist: Docia Chuck. Henrene Pastor , MD Age: 73 Referring MD:  Date of Birth: [birth year] Gender: Male Account #: [id number] Procedure:                Upper GI endoscopy Indications:              Follow-up of acute gastrojejunal ulcer. Last upper                            endoscopy July 29, 2019 with 8 mm anastomotic                            ulcer on the small bowel side. The patient is                            status post Roux-en-Y gastric bypass surgery. NOTE:                            The patient tells me that he has not been taking                            omeprazole therapy Medicines:                Monitored Anesthesia Care Procedure:                Pre-Anesthesia Assessment:                           - Prior to the procedure, a History and Physical                            was performed, and patient medications and                            allergies were reviewed. The patient's tolerance of                            previous anesthesia was also reviewed. The risks                            and benefits of the procedure and the sedation                            options and risks were discussed with the patient.                            All questions were answered, and informed consent                            was obtained. Prior Anticoagulants: The patient has                            taken Plavix (clopidogrel), last dose was 7 days  prior to procedure. ASA Grade Assessment: III - A                            patient with severe systemic disease. After                            reviewing the risks and benefits, the patient was                            deemed in satisfactory condition to undergo the                            procedure.                           After obtaining informed consent, the endoscope was       passed under direct vision. Throughout the                            procedure, the patient's blood pressure, pulse, and                            oxygen saturations were monitored continuously. The                            Endoscope was introduced through the mouth, and                            advanced to the jejunum. The upper GI endoscopy was                            accomplished without difficulty. The patient                            tolerated the procedure well. Scope In: Scope Out: Findings:                 The esophagus was normal.                           The stomach revealed evidence of prior Roux-en-Y                            gastric bypass surgery. The gastric pouch proper                            was unremarkable.                           The anastomosis was patent. The majority of the                            previously noted ulcer had healed the was small  residual ulceration with friability.                           The jejunum was normal. Complications:            No immediate complications. Estimated Blood Loss:     Estimated blood loss: none. Impression:               1. Incompletely healed ulcer. Still with some                            friability and minor bleeding                           2. Status post Roux-en-Y gastric bypass surgery.                           3. NOTE: Patient tells me that he has not been                            taking omeprazole therapy Recommendation:           1. Prescribe omeprazole 40 mg capsules; #60; 11                            refills. Open the contents of the capsule onto                            applesauce. Take 1 capsule twice daily                           2. Continue Carafate (sucralfate).                           3. AVOID all unnecessary NSAIDs (medications like                            ibuprofen, Advil, Aleve)                           4. Resume Plavix today                            5. Go to our laboratory for blood work today. CBC                            and ferritin level                           6. Please schedule EGD in the [place] in 4 to 6 weeks                            to reassess ulcer                           This information was discussed with the patient and  his wife. As well a procedure report provided. Docia Chuck. Henrene Pastor, MD 09/08/2019 10:48:49 AM This report has been signed electronically.

## 2019-09-08 NOTE — Progress Notes (Signed)
Pt's states no medical or surgical changes since previsit or office visit. 

## 2019-09-12 ENCOUNTER — Telehealth: Payer: Self-pay | Admitting: *Deleted

## 2019-09-12 NOTE — Telephone Encounter (Signed)
First follow up call attempt.  Message left on voicemail. 

## 2019-09-12 NOTE — Telephone Encounter (Signed)
  Follow up Call-  Call back number 09/08/2019 07/29/2019  Post procedure Call Back phone  # [phone]  Permission to leave phone message Yes Yes  Some recent data might be hidden     Patient questions:  Do you have a fever, pain , or abdominal swelling? No. Pain Score  0 *  Have you tolerated food without any problems? Yes.    Have you been able to return to your normal activities? Yes.    Do you have any questions about your discharge instructions: Diet   No. Medications  No. Follow up visit  No.  Do you have questions or concerns about your Care? No.  Actions: * If pain score is 4 or above: No action needed, pain <4.   1. Have you developed a fever since your procedure? no  2.   Have you had an respiratory symptoms (SOB or cough) since your procedure? no  3.   Have you tested positive for COVID 19 since your procedure n  4.   Have you had any family members/close contacts diagnosed with the COVID 19 since your procedure?  no   If yes to any of these questions please route to Joylene John, RN and Alphonsa Gin, Therapist, sports.

## 2019-09-22 ENCOUNTER — Other Ambulatory Visit: Payer: Self-pay

## 2019-09-22 DIAGNOSIS — Z20822 Contact with and (suspected) exposure to covid-19: Secondary | ICD-10-CM

## 2019-09-23 LAB — NOVEL CORONAVIRUS, NAA: SARS-CoV-2, NAA: NOT DETECTED

## 2019-10-03 ENCOUNTER — Encounter: Payer: Self-pay | Admitting: Internal Medicine

## 2019-10-13 ENCOUNTER — Encounter: Payer: Medicare Other | Admitting: Internal Medicine

## 2019-10-22 ENCOUNTER — Other Ambulatory Visit: Payer: Self-pay | Admitting: Internal Medicine

## 2019-11-04 ENCOUNTER — Ambulatory Visit (AMBULATORY_SURGERY_CENTER): Payer: Self-pay | Admitting: *Deleted

## 2019-11-04 ENCOUNTER — Other Ambulatory Visit: Payer: Self-pay

## 2019-11-04 VITALS — Temp 97.0°F | Ht 66.0 in | Wt 265.0 lb

## 2019-11-04 DIAGNOSIS — K259 Gastric ulcer, unspecified as acute or chronic, without hemorrhage or perforation: Secondary | ICD-10-CM

## 2019-11-04 DIAGNOSIS — Z01818 Encounter for other preprocedural examination: Secondary | ICD-10-CM

## 2019-11-04 NOTE — Progress Notes (Signed)
Patient denies any allergies to egg or soy products. Patient denies complications with anesthesia/sedation.  OSA - wears CPAP nightly.  Patient denies oxygen use at home and denies diet medications. Emmi instructions for colonoscopy/endoscopy explained and given to patient.

## 2019-11-15 ENCOUNTER — Ambulatory Visit (INDEPENDENT_AMBULATORY_CARE_PROVIDER_SITE_OTHER): Payer: Medicare Other

## 2019-11-15 ENCOUNTER — Other Ambulatory Visit: Payer: Self-pay | Admitting: Internal Medicine

## 2019-11-15 DIAGNOSIS — Z1159 Encounter for screening for other viral diseases: Secondary | ICD-10-CM

## 2019-11-16 LAB — SARS CORONAVIRUS 2 (TAT 6-24 HRS): SARS Coronavirus 2: NEGATIVE

## 2019-11-18 ENCOUNTER — Encounter: Payer: Self-pay | Admitting: Internal Medicine

## 2019-11-18 ENCOUNTER — Ambulatory Visit (AMBULATORY_SURGERY_CENTER): Payer: Medicare Other | Admitting: Internal Medicine

## 2019-11-18 ENCOUNTER — Other Ambulatory Visit: Payer: Self-pay

## 2019-11-18 VITALS — BP 185/109 | HR 53 | Temp 97.5°F | Resp 15 | Ht 66.0 in | Wt 265.0 lb

## 2019-11-18 DIAGNOSIS — K259 Gastric ulcer, unspecified as acute or chronic, without hemorrhage or perforation: Secondary | ICD-10-CM | POA: Diagnosis present

## 2019-11-18 MED ORDER — SUCRALFATE 1 GM/10ML PO SUSP: 1.0000 g | Freq: Four times a day (QID) | ORAL | 11 refills | Status: DC

## 2019-11-18 MED ORDER — SODIUM CHLORIDE 0.9 % IV SOLN: 500.0000 mL | Freq: Once | INTRAVENOUS | Status: DC

## 2019-11-18 NOTE — Op Note (Signed)
[place] Patient Name: Randall [name] Procedure Date: 11/18/2019 12:23 PM MRN: [id number] Endoscopist: Docia Chuck. Henrene Pastor , MD Age: 74 Referring MD:  Date of Birth: 1945-12-11 Gender: Male Account #: [id number] Procedure:                Upper GI endoscopy Indications:              Follow-up of peptic ulcer Medicines:                Monitored Anesthesia Care Procedure:                Pre-Anesthesia Assessment:                           - Prior to the procedure, a History and Physical                            was performed, and patient medications and                            allergies were reviewed. The patient's tolerance of                            previous anesthesia was also reviewed. The risks                            and benefits of the procedure and the sedation                            options and risks were discussed with the patient.                            All questions were answered, and informed consent                            was obtained. Prior Anticoagulants: The patient has                            taken Plavix (clopidogrel), last dose was day of                            procedure. ASA Grade Assessment: III - A patient                            with severe systemic disease. After reviewing the                            risks and benefits, the patient was deemed in                            satisfactory condition to undergo the procedure.                           After obtaining informed consent, the endoscope was  passed under direct vision. Throughout the                            procedure, the patient's blood pressure, pulse, and                            oxygen saturations were monitored continuously. The                            Endoscope was introduced through the mouth, and                            advanced to the jejunum. The upper GI endoscopy was                            accomplished without  difficulty. The patient                            tolerated the procedure well. Scope In: Scope Out: Findings:                 The esophagus was normal.                           The stomach revealed Roux-en-Y gastric bypass                            anatomy. The anastomosis was widely patent.                            Previously noted ulcer has healed. The jejunum was                            normal. Complications:            No immediate complications. Estimated Blood Loss:     Estimated blood loss: none. Impression:               1. Normal Roux-en-Y gastric bypass anatomy                           2. Previous marginal ulcer has healed. Recommendation:           - Patient has a contact number available for                            emergencies. The signs and symptoms of potential                            delayed complications were discussed with the                            patient. Return to normal activities tomorrow.                            Written discharge instructions were provided to the  patient.                           - Resume previous diet.                           - Continue present medications, including Plavix.                           - Return to the care of your primary provider. Docia Chuck. Henrene Pastor, MD 11/18/2019 12:33:25 PM This report has been signed electronically.

## 2019-11-18 NOTE — Patient Instructions (Signed)
YOU HAD AN ENDOSCOPIC PROCEDURE TODAY AT THE [facility]:   Refer to the procedure report that was given to you for any specific questions about what was found during the examination.  If the procedure report does not answer your questions, please call your gastroenterologist to clarify.  If you requested that your care partner not be given the details of your procedure findings, then the procedure report has been included in a sealed envelope for you to review at your convenience later.  YOU SHOULD EXPECT: Some feelings of bloating in the abdomen. Passage of more gas than usual.  Walking can help get rid of the air that was put into your GI tract during the procedure and reduce the bloating. If you had a lower endoscopy (such as a colonoscopy or flexible sigmoidoscopy) you may notice spotting of blood in your stool or on the toilet paper. If you underwent a bowel prep for your procedure, you may not have a normal bowel movement for a few days.  Please Note:  You might notice some irritation and congestion in your nose or some drainage.  This is from the oxygen used during your procedure.  There is no need for concern and it should clear up in a day or so.  SYMPTOMS TO REPORT IMMEDIATELY:   Following upper endoscopy (EGD)  Vomiting of blood or coffee ground material  New chest pain or pain under the shoulder blades  Painful or persistently difficult swallowing  New shortness of breath  Fever of 100F or higher  Black, tarry-looking stools  For urgent or emergent issues, a gastroenterologist can be reached at any hour by calling [phone].   DIET:  We do recommend a small meal at first, but then you may proceed to your regular diet.  Drink plenty of fluids but you should avoid alcoholic beverages for 24 hours.  ACTIVITY:  You should plan to take it easy for the rest of today and you should NOT DRIVE or use heavy machinery until tomorrow (because of the sedation medicines used  during the test).    FOLLOW UP: Our staff will call the number listed on your records 48-72 hours following your procedure to check on you and address any questions or concerns that you may have regarding the information given to you following your procedure. If we do not reach you, we will leave a message.  We will attempt to reach you two times.  During this call, we will ask if you have developed any symptoms of COVID 19. If you develop any symptoms (ie: fever, flu-like symptoms, shortness of breath, cough etc.) before then, please call [phone].  If you test positive for Covid 19 in the 2 weeks post procedure, please call and report this information to us.    If any biopsies were taken you will be contacted by phone or by letter within the next 1-3 weeks.  Please call us at [phone] if you have not heard about the biopsies in 3 weeks.    SIGNATURES/CONFIDENTIALITY: You and/or your care partner have signed paperwork which will be entered into your electronic medical record.  These signatures attest to the fact that that the information above on your After Visit Summary has been reviewed and is understood.  Full responsibility of the confidentiality of this discharge information lies with you and/or your care-partner. 

## 2019-11-18 NOTE — Progress Notes (Signed)
VS- Randall [name] Temperature- [name]  Pt's states no medical or surgical changes since previsit or office visit.  Per Dr. Henrene Pastor- ok to give pt a year's refill of Carafate

## 2019-11-18 NOTE — Progress Notes (Signed)
Pt tolerated well. VSS. Awake and to recovery. 

## 2019-11-22 ENCOUNTER — Telehealth: Payer: Self-pay | Admitting: *Deleted

## 2019-11-22 NOTE — Telephone Encounter (Signed)
  Follow up Call-  Call back number 11/18/2019 09/08/2019 07/29/2019  Post procedure Call Back phone  # [phone]  Permission to leave phone message Yes Yes Yes  Some recent data might be hidden     Patient questions:  Do you have a fever, pain , or abdominal swelling? No. Pain Score  0 *  Have you tolerated food without any problems? Yes.    Have you been able to return to your normal activities? Yes.    Do you have any questions about your discharge instructions: Diet   No. Medications  Yes.  -Had questions about how to take Carafate- instructed patient that order states 4 times daily. Patient verbalized understanding.  Follow up visit  No.  Do you have questions or concerns about your Care? No.  Actions: * If pain score is 4 or above: No action needed, pain <4.  1. Have you developed a fever since your procedure? no  2.   Have you had an respiratory symptoms (SOB or cough) since your procedure? no  3.   Have you tested positive for COVID 19 since your procedure no  4.   Have you had any family members/close contacts diagnosed with the COVID 19 since your procedure?  no   If yes to any of these questions please route to Joylene John, RN and Alphonsa Gin, Therapist, sports.

## 2019-12-19 ENCOUNTER — Ambulatory Visit: Payer: Medicare Other | Attending: Internal Medicine

## 2019-12-19 DIAGNOSIS — Z23 Encounter for immunization: Secondary | ICD-10-CM | POA: Insufficient documentation

## 2019-12-19 NOTE — Progress Notes (Signed)
   Covid-19 Vaccination Clinic  Name:  Randall [name]    MRN: [id number] DOB: [birth year]  12/19/2019  Randall [name] was observed post Covid-19 immunization for 15 minutes without incidence. He was provided with Vaccine Information Sheet and instruction to access the V-Safe system.   Randall [name] was instructed to call 911 with any severe reactions post vaccine: Marland Kitchen Difficulty breathing  . Swelling of your face and throat  . A fast heartbeat  . A bad rash all over your body  . Dizziness and weakness    Immunizations Administered    Name Date Dose VIS Date Route   Pfizer COVID-19 Vaccine 12/19/2019  9:55 AM 0.3 mL 09/30/2019 Intramuscular   Manufacturer: [place]   Lot: [id number]   [place]: [id number]

## 2019-12-30 ENCOUNTER — Other Ambulatory Visit: Payer: Self-pay | Admitting: Internal Medicine

## 2019-12-30 DIAGNOSIS — M48062 Spinal stenosis, lumbar region with neurogenic claudication: Secondary | ICD-10-CM

## 2020-01-08 ENCOUNTER — Other Ambulatory Visit: Payer: Self-pay

## 2020-01-08 ENCOUNTER — Ambulatory Visit
Admission: RE | Admit: 2020-01-08 | Discharge: 2020-01-08 | Disposition: A | Discharge status: Home / Self Care | Payer: Medicare Other | Source: Ambulatory Visit | Attending: Internal Medicine | Admitting: Internal Medicine

## 2020-01-08 DIAGNOSIS — M48062 Spinal stenosis, lumbar region with neurogenic claudication: Secondary | ICD-10-CM | POA: Diagnosis not present

## 2020-01-17 ENCOUNTER — Ambulatory Visit: Payer: Medicare Other | Attending: Internal Medicine

## 2020-01-17 DIAGNOSIS — Z23 Encounter for immunization: Secondary | ICD-10-CM

## 2020-01-17 NOTE — Progress Notes (Signed)
   Covid-19 Vaccination Clinic  Name:  Randall [name]    MRN: [id number] DOB: 11-30-1945  01/17/2020  [name] was observed post Covid-19 immunization for 15 minutes without incident. He was provided with Vaccine Information Sheet and instruction to access the V-Safe system.   [name] was instructed to call 911 with any severe reactions post vaccine: Marland Kitchen Difficulty breathing  . Swelling of face and throat  . A fast heartbeat  . A bad rash all over body  . Dizziness and weakness   Immunizations Administered    Name Date Dose VIS Date Route   Pfizer COVID-19 Vaccine 01/17/2020  9:47 AM 0.3 mL 09/30/2019 Intramuscular   Manufacturer: [place]   Lot: [id number]   [place]: [id number]

## 2020-01-19 ENCOUNTER — Other Ambulatory Visit: Payer: Self-pay | Admitting: Neurosurgery

## 2020-01-19 DIAGNOSIS — M5136 Other intervertebral disc degeneration, lumbar region: Secondary | ICD-10-CM

## 2020-01-19 DIAGNOSIS — M4316 Spondylolisthesis, lumbar region: Secondary | ICD-10-CM

## 2020-02-17 ENCOUNTER — Other Ambulatory Visit: Payer: Self-pay

## 2020-02-17 ENCOUNTER — Ambulatory Visit
Admission: RE | Admit: 2020-02-17 | Discharge: 2020-02-17 | Disposition: A | Discharge status: Home / Self Care | Payer: Medicare Other | Source: Ambulatory Visit | Attending: Neurosurgery | Admitting: Neurosurgery

## 2020-02-17 DIAGNOSIS — M4316 Spondylolisthesis, lumbar region: Secondary | ICD-10-CM | POA: Diagnosis present

## 2020-02-17 DIAGNOSIS — M5136 Other intervertebral disc degeneration, lumbar region: Secondary | ICD-10-CM | POA: Diagnosis not present

## 2020-03-14 DIAGNOSIS — Z79891 Long term (current) use of opiate analgesic: Secondary | ICD-10-CM

## 2020-08-30 NOTE — Progress Notes (Signed)
08/31/2020 9:17 AM   Randall [name] [birth year] [id number]  Reason for visit: Follow up UTIs, hydroceles  HPI: Randall [name] is a 74 y.o. with a PMH of hydroceles and rUTI's who presents today for a yearly visit.    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  We have been managing these conservatively, as they are minimally bothersome, not painful, and he has a number of comorbidities and anticoagulation.  He denies any infections since June 2020.  He is not noted any change in the hydroceles.  He states that when he urinates he tends to spray urine all over.  PVR 0  ML.  Patient denies any modifying or aggravating factors.  Patient denies any gross hematuria, dysuria or suprapubic/flank pain.  Patient denies any fevers, chills, nausea or vomiting.   ROS: Please see flowsheet from today's date for complete review of systems.  Physical Exam: BP 127/75   Pulse 80   Wt 267 lb (121.1 kg)   BMI 43.09 kg/m   Constitutional:  Well nourished. Alert and oriented, No acute distress. HEENT: [place] AT, mask in place.  Trachea midline Cardiovascular: No clubbing, cyanosis, or edema. Respiratory: Normal respiratory effort, no increased work of breathing.  GU: No CVA tenderness.  No bladder fullness or masses.  Patient with uncircumcised buried phallus. Foreskin easily retracted.  Urethral meatus is patent.  No penile discharge. No penile lesions or rashes. Scrotum without lesions, cysts, rashes and/or edema.  Moderate hydroceles bilaterally.   \Neurologic: Grossly intact, no focal deficits, moving all 4 extremities.  In wheel chair.   Psychiatric: Normal mood and affect.  Laboratory Specimen:  Urine  Ref Range & Units 2 mo ago  Color Yellow  Yellow      Clarity Clear  Clear      Specific Gravity 1.000 - 1.030  1.025      pH, Urine 5.0 - 8.0  6.0      Protein, Urinalysis Negative,  Trace mg/dL Negative      Glucose, Urinalysis Negative mg/dL Negative      Ketones, Urinalysis Negative mg/dL Negative      Blood, Urinalysis Negative  Negative      Nitrite, Urinalysis Negative  Negative      Leukocyte Esterase, Urinalysis Negative  Negative      White Blood Cells, Urinalysis None Seen, 0-3 /hpf None Seen      Red Blood Cells, Urinalysis None Seen, 0-3 /hpf None Seen      Bacteria, Urinalysis None Seen /hpf None Seen      Squamous Epithelial Cells, Urinalysis Rare, Few, None Seen /hpf Rare      Resulting Agency  [facility]  Specimen Collected: 06/18/20 7:11 AM Last Resulted: 06/18/20 8:20 AM  Received From: [facility]  Result Received: 08/06/20 10:48 AM     Assessment & Plan:    1. Bilateral hydroceles Likely contributing to some of his urinary symptoms of spraying of the urinary stream as he is unable to extend his penis beyond the foreskin and hydrocele allowing urine to be trapped within the foreskin  2. Buried penis Suggested that the patient engage in exercises that he can do in the sitting position in an effort to lose weight to address his urinary symptoms.    I have given him a handout regarding this.  Also suggested to decrease  his calorie intake by eating less portion sizes than his wife   RTC in 1 year for PVR   Zara Council, PA-C  [place] [address] [place], [place] [address] [phone]

## 2020-08-31 ENCOUNTER — Ambulatory Visit (INDEPENDENT_AMBULATORY_CARE_PROVIDER_SITE_OTHER): Payer: Medicare Other | Admitting: Urology

## 2020-08-31 ENCOUNTER — Other Ambulatory Visit: Payer: Self-pay

## 2020-08-31 ENCOUNTER — Encounter: Payer: Self-pay | Admitting: Urology

## 2020-08-31 VITALS — BP 127/75 | HR 80 | Wt 267.0 lb

## 2020-08-31 DIAGNOSIS — N4883 Acquired buried penis: Secondary | ICD-10-CM | POA: Diagnosis not present

## 2020-08-31 DIAGNOSIS — N432 Other hydrocele: Secondary | ICD-10-CM

## 2020-08-31 LAB — BLADDER SCAN AMB NON-IMAGING: Scan Result: 0

## 2020-09-21 ENCOUNTER — Other Ambulatory Visit: Payer: Self-pay | Admitting: Internal Medicine

## 2020-12-14 ENCOUNTER — Other Ambulatory Visit: Payer: Self-pay | Admitting: Internal Medicine

## 2020-12-18 ENCOUNTER — Telehealth: Payer: Self-pay | Admitting: Internal Medicine

## 2020-12-18 NOTE — Telephone Encounter (Signed)
[facility] is requesting a 90 day supply for the pt's CARAFATE 424mL.   3650mL

## 2020-12-24 MED ORDER — SUCRALFATE 1 GM/10ML PO SUSP
ORAL | 1 refills | Status: DC
Start: 2020-12-24 — End: 2022-01-23

## 2020-12-24 NOTE — Telephone Encounter (Signed)
90day supply sent.

## 2021-03-14 ENCOUNTER — Other Ambulatory Visit: Payer: Self-pay | Admitting: Internal Medicine

## 2021-07-23 ENCOUNTER — Other Ambulatory Visit: Payer: Self-pay | Admitting: Internal Medicine

## 2021-08-13 ENCOUNTER — Inpatient Hospital Stay
Admission: EM | Admit: 2021-08-13 | Discharge: 2021-08-19 | DRG: 378 | Disposition: A | Discharge status: Home / Self Care | Payer: Medicare Other | Attending: Internal Medicine | Admitting: Internal Medicine

## 2021-08-13 ENCOUNTER — Other Ambulatory Visit: Payer: Self-pay

## 2021-08-13 DIAGNOSIS — T85898A Other specified complication of other internal prosthetic devices, implants and grafts, initial encounter: Secondary | ICD-10-CM

## 2021-08-13 DIAGNOSIS — Z8042 Family history of malignant neoplasm of prostate: Secondary | ICD-10-CM | POA: Diagnosis not present

## 2021-08-13 DIAGNOSIS — Z6841 Body Mass Index (BMI) 40.0 and over, adult: Secondary | ICD-10-CM

## 2021-08-13 DIAGNOSIS — Z8673 Personal history of transient ischemic attack (TIA), and cerebral infarction without residual deficits: Secondary | ICD-10-CM

## 2021-08-13 DIAGNOSIS — Z79891 Long term (current) use of opiate analgesic: Secondary | ICD-10-CM

## 2021-08-13 DIAGNOSIS — M48061 Spinal stenosis, lumbar region without neurogenic claudication: Secondary | ICD-10-CM | POA: Diagnosis present

## 2021-08-13 DIAGNOSIS — Z96653 Presence of artificial knee joint, bilateral: Secondary | ICD-10-CM | POA: Diagnosis present

## 2021-08-13 DIAGNOSIS — Z86718 Personal history of other venous thrombosis and embolism: Secondary | ICD-10-CM

## 2021-08-13 DIAGNOSIS — K921 Melena: Secondary | ICD-10-CM

## 2021-08-13 DIAGNOSIS — K289 Gastrojejunal ulcer, unspecified as acute or chronic, without hemorrhage or perforation: Secondary | ICD-10-CM

## 2021-08-13 DIAGNOSIS — K284 Chronic or unspecified gastrojejunal ulcer with hemorrhage: Secondary | ICD-10-CM | POA: Diagnosis present

## 2021-08-13 DIAGNOSIS — Z9884 Bariatric surgery status: Secondary | ICD-10-CM

## 2021-08-13 DIAGNOSIS — G8929 Other chronic pain: Secondary | ICD-10-CM | POA: Diagnosis present

## 2021-08-13 DIAGNOSIS — Z20822 Contact with and (suspected) exposure to covid-19: Secondary | ICD-10-CM | POA: Diagnosis present

## 2021-08-13 DIAGNOSIS — M109 Gout, unspecified: Secondary | ICD-10-CM | POA: Diagnosis present

## 2021-08-13 DIAGNOSIS — K922 Gastrointestinal hemorrhage, unspecified: Secondary | ICD-10-CM

## 2021-08-13 DIAGNOSIS — Z7902 Long term (current) use of antithrombotics/antiplatelets: Secondary | ICD-10-CM

## 2021-08-13 DIAGNOSIS — D62 Acute posthemorrhagic anemia: Secondary | ICD-10-CM | POA: Diagnosis present

## 2021-08-13 DIAGNOSIS — Z87891 Personal history of nicotine dependence: Secondary | ICD-10-CM | POA: Diagnosis not present

## 2021-08-13 DIAGNOSIS — D509 Iron deficiency anemia, unspecified: Secondary | ICD-10-CM | POA: Diagnosis present

## 2021-08-13 DIAGNOSIS — Z888 Allergy status to other drugs, medicaments and biological substances status: Secondary | ICD-10-CM | POA: Diagnosis not present

## 2021-08-13 DIAGNOSIS — E876 Hypokalemia: Secondary | ICD-10-CM | POA: Diagnosis not present

## 2021-08-13 DIAGNOSIS — G4733 Obstructive sleep apnea (adult) (pediatric): Secondary | ICD-10-CM | POA: Diagnosis present

## 2021-08-13 DIAGNOSIS — K219 Gastro-esophageal reflux disease without esophagitis: Secondary | ICD-10-CM | POA: Diagnosis present

## 2021-08-13 DIAGNOSIS — I10 Essential (primary) hypertension: Secondary | ICD-10-CM | POA: Diagnosis present

## 2021-08-13 DIAGNOSIS — Z85820 Personal history of malignant melanoma of skin: Secondary | ICD-10-CM | POA: Diagnosis not present

## 2021-08-13 DIAGNOSIS — Z79899 Other long term (current) drug therapy: Secondary | ICD-10-CM | POA: Diagnosis not present

## 2021-08-13 DIAGNOSIS — Z886 Allergy status to analgesic agent status: Secondary | ICD-10-CM

## 2021-08-13 DIAGNOSIS — G4730 Sleep apnea, unspecified: Secondary | ICD-10-CM | POA: Diagnosis present

## 2021-08-13 DIAGNOSIS — D649 Anemia, unspecified: Secondary | ICD-10-CM

## 2021-08-13 LAB — PROTIME-INR
INR: 1.2 (ref 0.8–1.2)
Prothrombin Time: 15 seconds (ref 11.4–15.2)

## 2021-08-13 LAB — COMPREHENSIVE METABOLIC PANEL
ALT: 14 U/L (ref 0–44)
AST: 20 U/L (ref 15–41)
Albumin: 3.2 g/dL — ABNORMAL LOW (ref 3.5–5.0)
Alkaline Phosphatase: 87 U/L (ref 38–126)
Anion gap: 5 (ref 5–15)
BUN: 36 mg/dL — ABNORMAL HIGH (ref 8–23)
CO2: 27 mmol/L (ref 22–32)
Calcium: 8 mg/dL — ABNORMAL LOW (ref 8.9–10.3)
Chloride: 109 mmol/L (ref 98–111)
Creatinine, Ser: 0.9 mg/dL (ref 0.61–1.24)
GFR, Estimated: >60 mL/min (ref >60)
Glucose, Bld: 138 mg/dL — ABNORMAL HIGH (ref 70–99)
Potassium: 4.2 mmol/L (ref 3.5–5.1)
Sodium: 141 mmol/L (ref 135–145)
Total Bilirubin: 0.6 mg/dL (ref 0.3–1.2)
Total Protein: 5.7 g/dL — ABNORMAL LOW (ref 6.5–8.1)

## 2021-08-13 LAB — CBC
HCT: 23.9 % — ABNORMAL LOW (ref 39.0–52.0)
Hemoglobin: 8.1 g/dL — ABNORMAL LOW (ref 13.0–17.0)
MCH: 30 pg (ref 26.0–34.0)
MCHC: 33.9 g/dL (ref 30.0–36.0)
MCV: 88.5 fL (ref 80.0–100.0)
Platelets: 244 10*3/uL (ref 150–400)
RBC: 2.7 MIL/uL — ABNORMAL LOW (ref 4.22–5.81)
RDW: 13 % (ref 11.5–15.5)
WBC: 7.9 10*3/uL (ref 4.0–10.5)
nRBC: 0 % (ref 0.0–0.2)

## 2021-08-13 LAB — LIPASE, BLOOD: Lipase: 23 U/L (ref 11–51)

## 2021-08-13 LAB — RESP PANEL BY RT-PCR (FLU A&B, COVID) ARPGX2
Influenza A by PCR: NEGATIVE
Influenza B by PCR: NEGATIVE
SARS Coronavirus 2 by RT PCR: NEGATIVE

## 2021-08-13 MED ORDER — SODIUM CHLORIDE 0.9 % IV SOLN: INTRAVENOUS | Status: DC

## 2021-08-13 MED ORDER — PANTOPRAZOLE SODIUM 40 MG IV SOLR
40.0000 mg | Freq: Once | INTRAVENOUS | Status: AC
Start: 2021-08-13 — End: 2021-08-13
  Administered 2021-08-13: 40 mg via INTRAVENOUS
  Filled 2021-08-13: qty 40

## 2021-08-13 MED ORDER — TRAMADOL HCL 50 MG PO TABS: 50.0000 mg | ORAL_TABLET | Freq: Four times a day (QID) | ORAL | Status: DC | PRN

## 2021-08-13 MED ORDER — ONDANSETRON HCL 4 MG/2ML IJ SOLN: 4.0000 mg | Freq: Four times a day (QID) | INTRAMUSCULAR | Status: DC | PRN

## 2021-08-13 MED ORDER — ONDANSETRON HCL 4 MG PO TABS: 4.0000 mg | ORAL_TABLET | Freq: Four times a day (QID) | ORAL | Status: DC | PRN

## 2021-08-13 MED ORDER — ACETAMINOPHEN 650 MG RE SUPP
650.0000 mg | Freq: Four times a day (QID) | RECTAL | Status: DC | PRN
  Filled 2021-08-13: qty 1

## 2021-08-13 MED ORDER — PANTOPRAZOLE SODIUM 40 MG IV SOLR
40.0000 mg | Freq: Two times a day (BID) | INTRAVENOUS | Status: DC
  Administered 2021-08-14 – 2021-08-19 (×11): 40 mg via INTRAVENOUS
  Filled 2021-08-13 (×11): qty 40

## 2021-08-13 MED ORDER — SODIUM CHLORIDE 0.9 % IV BOLUS
1000.0000 mL | Freq: Once | INTRAVENOUS | Status: AC
  Administered 2021-08-13: 1000 mL via INTRAVENOUS

## 2021-08-13 MED ORDER — DIPHENHYDRAMINE HCL 25 MG PO CAPS: 25.0000 mg | ORAL_CAPSULE | Freq: Every evening | ORAL | Status: DC | PRN

## 2021-08-13 MED ORDER — ACETAMINOPHEN 325 MG PO TABS: 650.0000 mg | ORAL_TABLET | Freq: Four times a day (QID) | ORAL | Status: DC | PRN

## 2021-08-13 MED ORDER — ACETAMINOPHEN 500 MG PO TABS: 500.0000 mg | ORAL_TABLET | Freq: Every evening | ORAL | Status: DC | PRN

## 2021-08-13 MED ORDER — DIPHENHYDRAMINE-APAP (SLEEP) 25-500 MG PO TABS: 1.0000 | ORAL_TABLET | Freq: Every evening | ORAL | Status: DC | PRN

## 2021-08-13 NOTE — ED Provider Notes (Signed)
Emergency Medicine Provider Triage Evaluation Note  Randall [name] , a 75 y.o. male  was evaluated in triage.  Pt complains of dark stool blood in stool.  Review of Systems  Positive: Dark stools.  History of gastric ulcer.  Feeling fatigued slightly weak Negative: Abdominal pain.  No fevers chills.  Does not take any blood thinner except for Plavix  Physical Exam  BP (!) 109/59 (BP Location: Right Arm)   Pulse 93   Temp 99.1 F (37.3 C) (Oral)   Resp 16   Ht 5\' 6"  (1.676 m)   Wt 123.4 kg   SpO2 98%   BMI 43.90 kg/m  Gen:   Awake, no distress seated comfortably in wheelchair without distress Resp:  Normal effort  MSK:   Moves extremities without difficulty  Other:  Reports last dark bowel movement was about 4 PM  Medical Decision Making  Medically screening exam initiated at 7:31 PM.  Appropriate orders placed.  Raye Sorrow was informed that the remainder of the evaluation will be completed by another provider, this initial triage assessment does not replace that evaluation, and the importance of remaining in the ED until their evaluation is complete.  I reviewed patient's recent labs and he is noted to have labs drawn today and had a significant drop in his hemoglobin to 8.2 with previous check 13.  Referred to the ER for further evaluation  ----------------------------------------- 7:34 PM on 08/13/2021 ----------------------------------------- Patient being triaged to ED main care bed at this point due to concerns for GI bleeding with a significant drop in his hemoglobin from baseline.     Delman Kitten, MD 08/13/21 [phone]

## 2021-08-13 NOTE — ED Provider Notes (Signed)
[facility] Emergency Department Provider Note   ____________________________________________   Event Date/Time   First MD Initiated Contact with Patient 08/13/21 1951     (approximate)  I have reviewed the triage vital signs and the nursing notes.   HISTORY  Chief Complaint Rectal Bleeding    HPI Randall [name] is a 75 y.o. male who presents for dark tarry stools  LOCATION: Rectum DURATION: 1 day prior to arrival TIMING: Stable since onset SEVERITY: Moderate QUALITY: Melena CONTEXT: Patient states he has a history of Roux-en-Y gastric bypass and has had a GI bleed in the past secondary to an ulcer with similar symptoms to today.  Patient states that he had dark tarry stools that began yesterday and has had worsening fatigue since then to the point where he is barely able to stand up MODIFYING FACTORS: Patient is on Plavix.  Denies any exacerbating or relieving factors. ASSOCIATED SYMPTOMS: Significant generalized weakness   Per medical record review, patient has history of of GERD and Roux-en-Y gastric bypass          Past Medical History:  Diagnosis Date   Allergy    Anemia    Arthritis    Cataract    Chronic kidney disease    Kidney stone   Clotting disorder ([place])    Deep vein blood clot of left lower extremity ([place])    GERD (gastroesophageal reflux disease)    Gout    Hypertension    Melanoma ([place]) 2014   forehead   Sleep apnea    cpap - uses it nightly   Stroke [facility])    TIA    Patient Active Problem List   Diagnosis Date Noted   Gout 04/25/2019   Spinal stenosis, lumbar region, with neurogenic claudication 11/29/2018   Benign essential tremor 04/16/2017   Carpal tunnel syndrome 04/16/2017   Morbid obesity ([place]) 03/24/2017   Senile purpura ([place]) 11/03/2016   History of ulcer disease 04/14/2016   Bradycardia 11/01/2014   History of DVT (deep vein thrombosis) 10/04/2014   Primary osteoarthritis of right knee  10/04/2014   S/P knee replacement 05/12/2014   History of malignant melanoma of skin 02/17/2014   Ptosis 11/04/2013   Arthritis 02/15/2013   Essential hypertension 02/15/2013   Mixed hyperlipidemia 02/15/2013   Sleep apnea 02/15/2013    Past Surgical History:  Procedure Laterality Date   BACK SURGERY  2005   spinal stenosis   barett's esophagus  2014   CARPAL TUNNEL RELEASE Left 10/30/2017   COLONOSCOPY  07/29/2019   [facility]   Eye lid surgery Bilateral 11/15/2013   GASTRIC BYPASS  2014   POLYPECTOMY     removal melanoma  02/27/2014   forehead   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 Authorizing Provider  allopurinol (ZYLOPRIM) 300 MG tablet Take 300 mg by mouth daily.    [provider]  carvedilol (COREG) 12.5 MG tablet Take 12.5 mg by mouth 2 (two) times daily. 05/14/18   [provider]  Cholecalciferol (VITAMIN D3) 5000 UNITS TABS Take 5,000 Units by mouth daily.     [provider]  clopidogrel (PLAVIX) 75 MG tablet Take 75 mg by mouth daily. 04/30/18   [provider]  Cranberry 200 MG CAPS  09/01/19   [provider]  Cyanocobalamin (VITAMIN B-12) 2500 MCG SUBL Place 2,500 mcg under the tongue daily.     [provider]  diphenhydramine-acetaminophen (TYLENOL PM) 25-500 MG TABS Take 1 tablet by mouth at bedtime as needed (sleep).     [provider]  doxazosin (CARDURA) 8 MG tablet Take 8 mg by mouth at bedtime.  06/26/15   [provider]  ferrous sulfate 325 (65 FE) MG EC tablet Take 1 tablet (325 mg total) by mouth 2 (two) times daily. 09/08/19   Irene Shipper, MD   flintstones complete (FLINTSTONES) 60 MG chewable tablet Chew 1 tablet by mouth daily.     [provider]  fluocinonide (LIDEX) 0.05 % external solution Apply topically. 06/28/20   [provider]  hydrALAZINE (APRESOLINE) 100 MG tablet Take 100 mg by mouth 2 (two) times daily. 03/05/18   [provider]  hydrochlorothiazide (HYDRODIURIL) 25 MG tablet  06/07/19   [provider]  hydrocortisone (PROCTOZONE-HC) 2.5 % rectal cream APPLY TOPICALLY TWICE DAILY FOR 10 DAYS 09/08/19   [provider]  ketoconazole (NIZORAL) 2 % shampoo  05/06/19   [provider]  omeprazole (PRILOSEC) 40 MG capsule TAKE 1 CAPSULE BY MOUTH TWICE DAILY. OPEN CONTENTS OF CAPSULE ONTO APPLESAUCE 07/25/21   Irene Shipper, MD  OVER THE COUNTER MEDICATION Vicks Sinex Severe Nasal Decongestant, One drop in each nostril, 2 times daily.    [provider]  sucralfate (CARAFATE) 1 GM/10ML suspension Take 62ml by mouth 4 times a day 12/24/20   Irene Shipper, MD  telmisartan (MICARDIS) 80 MG tablet Take 80 mg by mouth daily. 04/30/18   [provider]  traMADol (ULTRAM) 50 MG tablet Take 50 mg by mouth every 6 (six) hours as needed for pain. 02/15/18   [provider]    Allergies Amlodipine, Nsaids, and Tolmetin  Family History  Problem Relation Age of Onset   Prostate cancer Father    Endometrial cancer Mother    Colon cancer Neg Hx    Esophageal cancer Neg Hx    Rectal cancer Neg Hx    Stomach cancer Neg Hx     Social History Social History   Tobacco Use   Smoking status: Never   Smokeless tobacco: Former    Types: Snuff  Vaping Use   Vaping Use: Never used  Substance Use Topics   Alcohol use: Yes    Alcohol/week: 0.0 standard drinks    Comment: 1 glass wine a month   Drug use: No    Review of Systems Constitutional: No fever/chills Eyes: No visual changes. ENT: No sore throat. Cardiovascular: Denies chest pain. Respiratory:  Denies shortness of breath. Gastrointestinal: No abdominal pain.  No nausea, no vomiting.  No diarrhea.  Endorses melena Genitourinary: Negative for dysuria. Musculoskeletal: Negative for acute arthralgias Skin: Negative for rash. Neurological: Negative for headaches, weakness/numbness/paresthesias in any extremity Psychiatric: Negative for suicidal ideation/homicidal ideation   ____________________________________________   PHYSICAL EXAM:  VITAL SIGNS: ED Triage Vitals  Enc Vitals Group     BP 08/13/21 1926 (!) 109/59     Pulse Rate 08/13/21 1926 93     Resp 08/13/21 1926 16     Temp 08/13/21 1926 99.1 F (37.3 C)     Temp Source 08/13/21 1926 Oral     SpO2 08/13/21  1926 98 %     Weight 08/13/21 1930 272 lb (123.4 kg)     Height 08/13/21 1930 5\' 6"  (1.676 m)     Head Circumference --      Peak Flow --      Pain Score 08/13/21 1930 0     Pain Loc --      Pain Edu? --      Excl. in [place]? --    Constitutional: Alert and oriented. Well appearing and in no acute distress. Eyes: Conjunctivae are pallid. PERRL. Head: Atraumatic. Nose: No congestion/rhinnorhea. Mouth/Throat: Mucous membranes are moist. Neck: No stridor Cardiovascular: Grossly normal heart sounds.  Good peripheral circulation. Respiratory: Normal respiratory effort.  No retractions. Gastrointestinal: Soft and nontender. No distention. Musculoskeletal: No obvious deformities Neurologic:  Normal speech and language. No gross focal neurologic deficits are appreciated. Skin:  Skin is warm and dry. No rash noted.  General pallor Psychiatric: Mood and affect are normal. Speech and behavior are normal.  ____________________________________________   LABS (all labs ordered are listed, but only abnormal results are displayed)  Labs Reviewed  CBC - Abnormal; Notable for the following components:      Result Value   RBC 2.70 (*)    Hemoglobin 8.1 (*)    HCT 23.9 (*)    All other components within normal limits   COMPREHENSIVE METABOLIC PANEL  PROTIME-INR  POC OCCULT BLOOD, ED  TYPE AND SCREEN   ____________________________________________  EKG  ED ECG REPORT I, Naaman Plummer, the attending physician, personally viewed and interpreted this ECG.  Date: 08/13/2021 EKG Time: 1914 Rate: 83 Rhythm: normal sinus rhythm QRS Axis: normal Intervals: normal ST/T Wave abnormalities: normal Narrative Interpretation: 1 PAC.  Otherwise normal sinus rhythm.  No evidence of acute ischemia  PROCEDURES  Procedure(s) performed (including Critical Care):  .1-3 Lead EKG Interpretation Performed by: Naaman Plummer, MD Authorized by: Naaman Plummer, MD     Interpretation: normal     ECG rate:  88   ECG rate assessment: normal     Rhythm: sinus rhythm     Ectopy: none     Conduction: normal    CRITICAL CARE Performed by: Naaman Plummer   Total critical care time: 25 minutes  Critical care time was exclusive of separately billable procedures and treating other patients.  Critical care was necessary to treat or prevent imminent or life-threatening deterioration.  Critical care was time spent personally by me on the following activities: development of treatment plan with patient and/or surrogate as well as nursing, discussions with consultants, evaluation of patient's response to treatment, examination of patient, obtaining history from patient or surrogate, ordering and performing treatments and interventions, ordering and review of laboratory studies, ordering and review of radiographic studies, pulse oximetry and re-evaluation of patient's condition.  ____________________________________________   INITIAL IMPRESSION / ASSESSMENT AND PLAN / ED COURSE  As part of my medical decision making, I reviewed the following data within the electronic medical record, if available:  Nursing notes reviewed and incorporated, Labs reviewed, EKG interpreted, Old chart reviewed, Radiograph reviewed and Notes  from prior ED visits reviewed and incorporated        + black stool per rectum Given history and exam patients presentation most consistent with upper GI bleed possibly secondary to peptic ulcer disease or variceal bleeding. I have low suspicion for aortoenteric fistula, ENT bleeding mimic, [place], Pulmonary bleeding mimic.  Workup: CBC, BMP, LFTs, Lipase, PT/INR, Type and Screen  Interventions: Analgesia and  antiemetic medications PRN Protonix 40mg  IVP  PRN  PRBC transfusion  Findings: Hb: 8.1  Disposition: Admit for close monitoring.      ____________________________________________   FINAL CLINICAL IMPRESSION(S) / ED DIAGNOSES  Final diagnoses:  None     ED Discharge Orders     None        Note:  This document was prepared using Dragon voice recognition software and may include unintentional dictation errors.    Naaman Plummer, MD 08/13/21 2130

## 2021-08-13 NOTE — ED Triage Notes (Signed)
Pt to ED from home c/o rectal bleeding since Monday.  States saw PCP today with bloodwork done, HGB 8.2 today.  Pt A&Ox4, skin WNL, chest rise even and unlabored, in NAD.  Pt states some weakness too.  A&Ox4, chest rise even and unlabored.

## 2021-08-13 NOTE — H&P (Signed)
History and Physical    Randall [name] XNA:[id number] DOB: [birth year] DOA: 08/13/2021  PCP: Adin Hector, MD   Patient coming from: home  I have personally briefly reviewed patient's old medical records in [place]  Chief Complaint: dark stool, fatigue  HPI: Randall [name] is a 75 y.o. male with medical history significant for Chronic pain on chronic opiates,, hypertension, OSA on CPAP,, and class III obesity, s/p gastric bypass, bleeding anastomotic ulcer in October 2020, healed on repeat EGD in November 2021, who presents to the ED with a complaint of dark red blood per rectum x1 day, associated with generalized fatigue.  Stool is dark and tarry.  He denies chest pain or shortness of breath or palpitations.  Denies abdominal pain, nausea or vomiting.    ED course: On arrival temperature 99.1, BP 109/59 with pulse of 93 Blood work with hemoglobin of 8.1, down from 13.3 on 06/26/2021 Other blood work unremarkable  EKG, personally viewed and interpreted: Normal sinus rhythm with nonspecific ST-T wave changes  Patient was given an IV fluid bolus, typed and crossed and hospitalist consulted for admission.  Review of Systems: As per HPI otherwise all other systems on review of systems negative.    Past Medical History:  Diagnosis Date   Allergy    Anemia    Arthritis    Cataract    Chronic kidney disease    Kidney stone   Clotting disorder ([place])    Deep vein blood clot of left lower extremity (HCC)    GERD (gastroesophageal reflux disease)    Gout    Hypertension    Melanoma ([place]) 2014   forehead   Sleep apnea    cpap - uses it nightly   Stroke [facility])    TIA    Past Surgical History:  Procedure Laterality Date   BACK SURGERY  2005   spinal stenosis   barett's esophagus  2014   CARPAL TUNNEL RELEASE Left 10/30/2017   COLONOSCOPY  07/29/2019   [facility]   Eye lid surgery Bilateral 11/15/2013   GASTRIC BYPASS  2014   POLYPECTOMY     removal melanoma   02/27/2014   forehead   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 He has quit using smokeless tobacco.  His smokeless tobacco use included snuff. He reports current alcohol use. He reports that he does not use drugs.  Allergies  Allergen Reactions   Amlodipine Other (See Comments)    Worsened edema.   Nsaids Other (See Comments)    Not supposed to take due to gastric bypass surgery   Tolmetin Other (See Comments)    Not supposed to take due to gastric bypass surgery    Family History  Problem Relation Age of Onset   Prostate cancer Father    Endometrial cancer Mother    Colon cancer Neg Hx    Esophageal cancer Neg Hx    Rectal cancer Neg Hx    Stomach cancer Neg Hx       Prior to Admission medications   Medication Sig Start Date End Date Taking? Authorizing Provider  allopurinol (ZYLOPRIM) 300 MG tablet Take 300 mg  by mouth daily.   Yes [provider]  Cholecalciferol (VITAMIN D3) 5000 UNITS TABS Take 5,000 Units by mouth daily.    Yes [provider]  clopidogrel (PLAVIX) 75 MG tablet Take 75 mg by mouth daily. 04/30/18  Yes [provider]  Cranberry 200 MG CAPS  09/01/19  Yes [provider]  Cyanocobalamin (VITAMIN B-12) 2500 MCG SUBL Place 2,500 mcg under the tongue daily.    Yes [provider]  doxazosin (CARDURA) 8 MG tablet Take 8 mg by mouth at bedtime.  06/26/15  Yes [provider]  ferrous sulfate 325 (65 FE) MG EC tablet Take 1 tablet (325 mg total) by mouth 2 (two) times daily. 09/08/19  Yes Irene Shipper, MD  flintstones complete (FLINTSTONES) 60 MG chewable tablet Chew 1 tablet by mouth daily.    Yes [provider]   hydrALAZINE (APRESOLINE) 100 MG tablet Take 100 mg by mouth 2 (two) times daily. 03/05/18  Yes [provider]  hydrochlorothiazide (HYDRODIURIL) 25 MG tablet  06/07/19  Yes [provider]  hydrocortisone (ANUSOL-HC) 2.5 % rectal cream APPLY TOPICALLY TWICE DAILY FOR 10 DAYS 09/08/19  Yes [provider]  ketoconazole (NIZORAL) 2 % shampoo  05/06/19  Yes [provider]  omeprazole (PRILOSEC) 40 MG capsule TAKE 1 CAPSULE BY MOUTH TWICE DAILY. OPEN CONTENTS OF CAPSULE ONTO APPLESAUCE 07/25/21  Yes Irene Shipper, MD  OVER THE COUNTER MEDICATION Vicks Sinex Severe Nasal Decongestant, One drop in each nostril, 2 times daily.   Yes [provider]  sucralfate (CARAFATE) 1 GM/10ML suspension Take 58ml by mouth 4 times a day 12/24/20  Yes Irene Shipper, MD  telmisartan (MICARDIS) 80 MG tablet Take 80 mg by mouth daily. 04/30/18  Yes [provider]  traMADol (ULTRAM) 50 MG tablet Take 50 mg by mouth every 6 (six) hours as needed for pain. 02/15/18  Yes [provider]  carvedilol (COREG) 12.5 MG tablet Take 12.5 mg by mouth 2 (two) times daily. 05/14/18   [provider]  diphenhydramine-acetaminophen (TYLENOL PM) 25-500 MG TABS Take 1 tablet by mouth at bedtime as needed (sleep).     [provider]  fluocinonide (LIDEX) 0.05 % external solution Apply topically. Patient not taking: No sig reported 06/28/20   [provider]    Physical Exam: Vitals:   08/13/21 1926 08/13/21 1930 08/13/21 1951 08/13/21 2002  BP: (!) 109/59  112/67   Pulse: 93  82 87  Resp: 16  18 18   Temp: 99.1 F (37.3 C)     TempSrc: Oral     SpO2: 98%  99% 99%  Weight:  123.4 kg    Height:  5\' 6"  (1.676 m)       Vitals:   08/13/21 1926 08/13/21 1930 08/13/21 1951 08/13/21 2002  BP: (!) 109/59  112/67   Pulse: 93  82 87  Resp: 16  18 18   Temp: 99.1 F (37.3 C)     TempSrc: Oral     SpO2: 98%  99% 99%  Weight:  123.4 kg    Height:  5'  6" (1.676 m)        Constitutional: Alert and oriented x 3 . Not in any apparent distress HEENT:      Head: Normocephalic and atraumatic.         Eyes: PERLA, EOMI, Conjunctivae are normal. Sclera is non-icteric.       Mouth/Throat: Mucous membranes are moist.  Neck: Supple with no signs of meningismus. Cardiovascular: Regular rate and rhythm. No murmurs, gallops, or rubs. 2+ symmetrical distal pulses are present . No JVD. No LE edema Respiratory: Respiratory effort normal .Lungs sounds clear bilaterally. No wheezes, crackles, or rhonchi.  Gastrointestinal: Soft, non tender, and non distended with positive bowel sounds.  Genitourinary: No CVA tenderness. Musculoskeletal: Nontender with normal range of motion in all extremities. No cyanosis, or erythema of extremities. Neurologic:  Face is symmetric. Moving all extremities. No gross focal neurologic deficits . Skin: Skin is warm, dry.  No rash or ulcers Psychiatric: Mood and affect are normal    Labs on Admission: I have personally reviewed following labs and imaging studies  CBC: Recent Labs  Lab 08/13/21 1946  WBC 7.9  HGB 8.1*  HCT 23.9*  MCV 88.5  PLT 829   Basic Metabolic Panel: Recent Labs  Lab 08/13/21 1946  NA 141  K 4.2  CL 109  CO2 27  GLUCOSE 138*  BUN 36*  CREATININE 0.90  CALCIUM 8.0*   GFR: Estimated Creatinine Clearance: 89.2 mL/min (by C-G formula based on SCr of 0.9 mg/dL). Liver Function Tests: Recent Labs  Lab 08/13/21 1946  AST 20  ALT 14  ALKPHOS 87  BILITOT 0.6  PROT 5.7*  ALBUMIN 3.2*   No results for input(s): LIPASE, AMYLASE in the last 168 hours. No results for input(s): AMMONIA in the last 168 hours. Coagulation Profile: Recent Labs  Lab 08/13/21 1946  INR 1.2   Cardiac Enzymes: No results for input(s): CKTOTAL, CKMB, CKMBINDEX, TROPONINI in the last 168 hours. BNP (last 3 results) No results for input(s): PROBNP in the last 8760 hours. HbA1C: No results for  input(s): HGBA1C in the last 72 hours. CBG: No results for input(s): GLUCAP in the last 168 hours. Lipid Profile: No results for input(s): CHOL, HDL, LDLCALC, TRIG, CHOLHDL, LDLDIRECT in the last 72 hours. Thyroid Function Tests: No results for input(s): TSH, T4TOTAL, FREET4, T3FREE, THYROIDAB in the last 72 hours. Anemia Panel: No results for input(s): VITAMINB12, FOLATE, FERRITIN, TIBC, IRON, RETICCTPCT in the last 72 hours. Urine analysis:    Component Value Date/Time   COLORURINE STRAW (A) 05/19/2018 1427   APPEARANCEUR Hazy (A) 09/01/2019 0954   LABSPEC 1.004 (L) 05/19/2018 1427   PHURINE 6.0 05/19/2018 1427   GLUCOSEU Negative 09/01/2019 0954   HGBUR NEGATIVE 05/19/2018 1427   BILIRUBINUR Negative 09/01/2019 0954   KETONESUR NEGATIVE 05/19/2018 1427   PROTEINUR Negative 09/01/2019 0954   PROTEINUR NEGATIVE 05/19/2018 1427   NITRITE Negative 09/01/2019 0954   NITRITE POSITIVE (A) 05/19/2018 1427   LEUKOCYTESUR Negative 09/01/2019 0954    Radiological Exams on Admission: No results found.   Assessment/Plan 75 year old male chronic pain on chronic opiates,, hypertension, OSA on CPAP,, and class III obesity, s/p gastric bypass, bleeding anastomotic ulcer in October 2020, healed on repeat EGD in November 2021, who presents to the ED with a complaint of black stool x1 day    Melena   Acute blood loss anemia   History of Anastomotic ulcer S/P gastric bypass - Patient presents with a 1 day history of dark tarry stool with hemoglobin 8.1, down from 13.3 on 06/26/2021 with past history of bleeding anastomotic ulcer.  Currently hemodynamically stable - Serial H&H and transfuse if below 7 - S/p 1 L fluid bolus in the ED - IV Protonix - GI consult to evaluate for EGD in the morning - Keep n.p.o. for possible procedure - Hold Plavix (uncertain indication)  Essential hypertension - Soft blood pressure with SBP 105 in the ED so will hold home antihypertensives in the context of GI  bleed - Close hemodynamic monitoring   Gout - Stable.  Resume meds when eating    Sleep apnea -Continue CPAP    Morbid obesity (HCC) - Complicating factor to overall prognosis and care    Chronic pain from spinal stenosis -Stable.  Avoid NSAIDs    DVT prophylaxis: SCDs Code Status: full code  Family Communication: Wife at bedside Disposition Plan: Back to previous home environment Consults called: GI Status:At the time of admission, it appears that the appropriate admission status for this patient is INPATIENT. This is judged to be reasonable and necessary in order to provide the required intensity of service to ensure the patient's safety given the presenting symptoms, physical exam findings, and initial radiographic and laboratory data in the context of their  Comorbid conditions.   Patient requires inpatient status due to high intensity of service, high risk for further deterioration and high frequency of surveillance required.   I certify that at the point of admission it is my clinical judgment that the patient will require inpatient hospital care spanning beyond [place] MD [facility]     08/13/2021, 9:19 PM

## 2021-08-13 NOTE — ED Notes (Signed)
ERMD at bedside at this time 

## 2021-08-14 ENCOUNTER — Encounter: Payer: Self-pay | Admitting: Internal Medicine

## 2021-08-14 DIAGNOSIS — I10 Essential (primary) hypertension: Secondary | ICD-10-CM | POA: Diagnosis not present

## 2021-08-14 DIAGNOSIS — D62 Acute posthemorrhagic anemia: Secondary | ICD-10-CM | POA: Diagnosis not present

## 2021-08-14 DIAGNOSIS — K921 Melena: Secondary | ICD-10-CM

## 2021-08-14 DIAGNOSIS — K922 Gastrointestinal hemorrhage, unspecified: Secondary | ICD-10-CM

## 2021-08-14 LAB — CBC WITH DIFFERENTIAL/PLATELET
Abs Immature Granulocytes: 0.01 10*3/uL (ref 0.00–0.07)
Basophils Absolute: 0.1 10*3/uL (ref 0.0–0.1)
Basophils Relative: 1 %
Eosinophils Absolute: 0.1 10*3/uL (ref 0.0–0.5)
Eosinophils Relative: 2 %
HCT: 20.3 % — ABNORMAL LOW (ref 39.0–52.0)
Hemoglobin: 7 g/dL — ABNORMAL LOW (ref 13.0–17.0)
Immature Granulocytes: 0 %
Lymphocytes Relative: 27 %
Lymphs Abs: 1.6 10*3/uL (ref 0.7–4.0)
MCH: 30.3 pg (ref 26.0–34.0)
MCHC: 34.5 g/dL (ref 30.0–36.0)
MCV: 87.9 fL (ref 80.0–100.0)
Monocytes Absolute: 0.5 10*3/uL (ref 0.1–1.0)
Monocytes Relative: 8 %
Neutro Abs: 3.6 10*3/uL (ref 1.7–7.7)
Neutrophils Relative %: 62 %
Platelets: 169 10*3/uL (ref 150–400)
RBC: 2.31 MIL/uL — ABNORMAL LOW (ref 4.22–5.81)
RDW: 14.2 % (ref 11.5–15.5)
WBC: 5.9 10*3/uL (ref 4.0–10.5)
nRBC: 0 % (ref 0.0–0.2)

## 2021-08-14 LAB — HEMOGLOBIN AND HEMATOCRIT, BLOOD
HCT: 23.9 % — ABNORMAL LOW (ref 39.0–52.0)
HCT: 18.2 % — ABNORMAL LOW (ref 39.0–52.0)
Hemoglobin: 8.4 g/dL — ABNORMAL LOW (ref 13.0–17.0)
Hemoglobin: 6.4 g/dL — ABNORMAL LOW (ref 13.0–17.0)

## 2021-08-14 LAB — FERRITIN: Ferritin: 29 ng/mL (ref 24–336)

## 2021-08-14 LAB — IRON AND TIBC
Iron: 43 ug/dL — ABNORMAL LOW (ref 45–182)
Saturation Ratios: 16 % — ABNORMAL LOW (ref 17.9–39.5)
TIBC: 265 ug/dL (ref 250–450)
UIBC: 222 ug/dL

## 2021-08-14 LAB — PREPARE RBC (CROSSMATCH): Order Confirmation: POSITIVE

## 2021-08-14 MED ORDER — ACETAMINOPHEN 500 MG PO TABS: 500.0000 mg | ORAL_TABLET | Freq: Every evening | ORAL | Status: DC | PRN

## 2021-08-14 MED ORDER — SODIUM CHLORIDE 0.9% IV SOLUTION
Freq: Once | INTRAVENOUS | Status: AC
  Filled 2021-08-14: qty 250

## 2021-08-14 MED ORDER — DIPHENHYDRAMINE HCL 25 MG PO CAPS
50.0000 mg | ORAL_CAPSULE | Freq: Every evening | ORAL | Status: DC | PRN
  Administered 2021-08-14 – 2021-08-18 (×6): 50 mg via ORAL
  Filled 2021-08-14 (×6): qty 2

## 2021-08-14 NOTE — ED Notes (Signed)
MD Vicente Males) made aware of the patients NPO status for nearly 24 hours - pt was ordered a clear liquid diet until midnight.

## 2021-08-14 NOTE — ED Notes (Signed)
Dr. Vicente Males (GI) made aware of the patients repeat Hgb improvement post transfusion.

## 2021-08-14 NOTE — ED Notes (Signed)
Blood transfusion stopped, no reactions noted. Pt denies any complaints. VSS.

## 2021-08-14 NOTE — Consult Note (Signed)
Jonathon Bellows , MD [address], [place], [place], [place], [address] [address], [place], [place], [place], [address] Phone: [phone]  Fax: [phone]  Consultation  Referring Provider:     GI bleed Primary Care Physician:  Adin Hector, MD Primary Gastroenterologist:  Dr.[name]          Reason for Consultation:     GI bleed  Date of Admission:  08/13/2021 Date of Consultation:  08/14/2021         HPI:   Randall [name] is a 75 y.o. male Who has a history of peptic ulcer and last had an endoscopy in January 2021.  He has a Roux-en-Y gastric bypass.  At that time the prior marginal ulcer had healed.  In November 2020 the patient had an upper endoscopy for a small ulcer at the gastrojejunal anastomotic area.  At that point of time it was deemed that it was secondary to not taking his PPI as prescribed.  I cannot see any testing for H. pylori on epic. He presented to the ER with dark stools and fatigue of 1 day duration.  On admission hemoglobin was 8.1 g down from 13.3 g on 06/26/2021.  He has been on Plavix.This morning hemoglobin is 6.4 g.  I went to see him in the afternoon and he had just finished his blood transfusion , he said that he had 3 tarrry black bowel movements which began yesterday , no hematemesis, no abdominal pain , no use of NSAID's, last dose of Plavix last evening . No other complaints.  Past Medical History:  Diagnosis Date   Allergy    Anemia    Arthritis    Cataract    Chronic kidney disease    Kidney stone   Clotting disorder ([place])    Deep vein blood clot of left lower extremity (HCC)    GERD (gastroesophageal reflux disease)    Gout    Hypertension    Melanoma ([place]) 2014   forehead   Sleep apnea    cpap - uses it nightly   Stroke [facility])    TIA    Past Surgical History:  Procedure Laterality Date   BACK SURGERY  2005   spinal stenosis   barett's esophagus  2014   CARPAL TUNNEL RELEASE Left 10/30/2017   COLONOSCOPY  07/29/2019    [facility]   Eye lid surgery Bilateral 11/15/2013   GASTRIC BYPASS  2014   POLYPECTOMY     removal melanoma  02/27/2014   forehead   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 Authorizing Provider  allopurinol (ZYLOPRIM) 300 MG tablet Take 300 mg by mouth daily.   Yes [provider]  Cholecalciferol (VITAMIN D3) 5000 UNITS TABS Take 5,000 Units by mouth daily.    Yes [provider]  clopidogrel (PLAVIX) 75 MG tablet Take 75 mg by mouth daily. 04/30/18  Yes [provider]  Cranberry 200 MG CAPS  09/01/19  Yes [provider]  Cyanocobalamin (VITAMIN B-12) 2500 MCG SUBL Place 2,500 mcg under the tongue daily.    Yes [provider]  doxazosin (  CARDURA) 8 MG tablet Take 8 mg by mouth at bedtime.  06/26/15  Yes [provider]  ferrous sulfate 325 (65 FE) MG EC tablet Take 1 tablet (325 mg total) by mouth 2 (two) times daily. 09/08/19  Yes Irene Shipper, MD  flintstones complete (FLINTSTONES) 60 MG chewable tablet Chew 1 tablet by mouth daily.    Yes [provider]  hydrALAZINE (APRESOLINE) 100 MG tablet Take 100 mg by mouth 2 (two) times daily. 03/05/18  Yes [provider]  hydrochlorothiazide (HYDRODIURIL) 25 MG tablet  06/07/19  Yes [provider]  hydrocortisone (ANUSOL-HC) 2.5 % rectal cream APPLY TOPICALLY TWICE DAILY FOR 10 DAYS 09/08/19  Yes [provider]  ketoconazole (NIZORAL) 2 % shampoo  05/06/19  Yes [provider]  omeprazole (PRILOSEC) 40 MG capsule TAKE 1 CAPSULE BY MOUTH TWICE DAILY. OPEN CONTENTS OF CAPSULE ONTO APPLESAUCE 07/25/21  Yes Irene Shipper, MD   OVER THE COUNTER MEDICATION Vicks Sinex Severe Nasal Decongestant, One drop in each nostril, 2 times daily.   Yes [provider]  sucralfate (CARAFATE) 1 GM/10ML suspension Take 81ml by mouth 4 times a day 12/24/20  Yes Irene Shipper, MD  telmisartan (MICARDIS) 80 MG tablet Take 80 mg by mouth daily. 04/30/18  Yes [provider]  traMADol (ULTRAM) 50 MG tablet Take 50 mg by mouth every 6 (six) hours as needed for pain. 02/15/18  Yes [provider]  carvedilol (COREG) 12.5 MG tablet Take 12.5 mg by mouth 2 (two) times daily. 05/14/18   [provider]  diphenhydramine-acetaminophen (TYLENOL PM) 25-500 MG TABS Take 1 tablet by mouth at bedtime as needed (sleep).     [provider]  fluocinonide (LIDEX) 0.05 % external solution Apply topically. Patient not taking: No sig reported 06/28/20   [provider]    Family History  Problem Relation Age of Onset   Prostate cancer Father    Endometrial cancer Mother    Colon cancer Neg Hx    Esophageal cancer Neg Hx    Rectal cancer Neg Hx    Stomach cancer Neg Hx      Social History   Tobacco Use   Smoking status: Never   Smokeless tobacco: Former    Types: Snuff  Vaping Use   Vaping Use: Never used  Substance Use Topics   Alcohol use: Yes    Alcohol/week: 0.0 standard drinks    Comment: 1 glass wine a month   Drug use: No    Allergies as of 08/13/2021 - Review Complete 08/13/2021  Allergen Reaction Noted   Amlodipine Other (See Comments) 06/08/2015   Nsaids Other (See Comments) 06/08/2015   Tolmetin Other (See Comments) 06/08/2015    Review of Systems:    All systems reviewed and negative except where noted in HPI.   Physical Exam:  Vital signs in last 24 hours: Temp:  [97.9 F (36.6 C)-99.1 F (37.3 C)] 98 F (36.7 C) (10/26 1245) Pulse Rate:  [57-93] 63 (10/26 1245) Resp:  [13-30] 19 (10/26 1245) BP: (105-138)/(50-90) 130/66 (10/26 1245) SpO2:  [96 %-100 %] 97 % (10/26  1245) Weight:  [123.4 kg] 123.4 kg (10/25 1930)   General:   Pleasant, cooperative in NAD Head:  Normocephalic and atraumatic. Eyes:   No icterus.   Conjunctiva pink. PERRLA. Ears:  Normal auditory acuity. Neck:  Supple; no masses or thyroidomegaly Lungs: Respirations even and unlabored. Lungs clear to auscultation bilaterally.   No wheezes, crackles, or rhonchi.  Heart:  Regular rate and rhythm;  Without murmur, clicks, rubs or gallops Abdomen:  Soft, nondistended, nontender. Normal bowel sounds. No appreciable masses or hepatomegaly.  No rebound or guarding.  Neurologic:  Alert and oriented x3;  grossly normal neurologically. Skin:  Intact without significant lesions or rashes. Cervical Nodes:  No significant cervical adenopathy. Psych:  Alert and cooperative. Normal affect.  LAB RESULTS: Recent Labs    08/13/21 1946 08/14/21 0114 08/14/21 0622  WBC 7.9  --   --   HGB 8.1* 8.4* 6.4*  HCT 23.9* 23.9* 18.2*  PLT 244  --   --    BMET Recent Labs    08/13/21 1946  NA 141  K 4.2  CL 109  CO2 27  GLUCOSE 138*  BUN 36*  CREATININE 0.90  CALCIUM 8.0*   LFT Recent Labs    08/13/21 1946  PROT 5.7*  ALBUMIN 3.2*  AST 20  ALT 14  ALKPHOS 87  BILITOT 0.6   PT/INR Recent Labs    08/13/21 1946  LABPROT 15.0  INR 1.2    STUDIES: No results found.    Impression / Plan:   Randall [name] is a 75 y.o. y/o male with a prior history of Roux-en-Y gastric bypass and prior anastomotic ulcer at gastrojejunal anastomosis, on Plavix presents the emergency room with melena and anemia.  Over 5 g drop in hemoglobin, elevation in BUN/creatinine ratio.  Likely has had an upper GI bleed.  Likely anastomotic ulcers while on Plavix.  Plan 1.  IV PPI 2.  Monitor CBC and transfuse as needed. 3.  We will plan for EGD when hemoglobin is over 7 g and in the interim if he has further bleeding to consider CT angiogram. Ideally would need to hold off Plavix for a few days before EGD. Dr  Allen Norris will assess tomorrow re timing of endoscopy based on clinical status. Awaiting post transfusion Hb check . He has just completed his blood transfusion. 4.  I will order H. pylori serology as he has never had it done as a stool test could have a false negative result in the setting of an acute GI bleed.  Thank you for involving me in the care of this patient.      LOS: 1 day   Jonathon Bellows, MD  08/14/2021, 2:15 PM

## 2021-08-14 NOTE — ED Notes (Signed)
Pt provided a dinner tray from dietary.

## 2021-08-14 NOTE — ED Notes (Signed)
Hospitalist & GI paged about the patients repeat CBC following his blood transfusion. View orders for intervention.

## 2021-08-14 NOTE — ED Notes (Signed)
1 unit RBC's started

## 2021-08-14 NOTE — ED Notes (Signed)
Pt ambulated to the restroom with standby assist. Pt had a small dark colored BM. Pt transitioned to a hospital bed to promote comfort.

## 2021-08-14 NOTE — ED Notes (Signed)
Pt resting comfortably at this time. Pt denies any needs at this time. Call bell in reach.

## 2021-08-14 NOTE — Progress Notes (Signed)
Progress Note    Randall [name]  RWE:[id number] DOB: July 23, 1946  DOA: 08/13/2021 PCP: Adin Hector, MD      Brief Narrative:    Medical records reviewed and are as summarized below:  Randall [name] is a 75 y.o. male with medical history significant for chronic pain on opiates, hypertension, history of stroke on Plavix, history of DVT left lower extremity, gout, OSA on CPAP, morbid obesity, s/p gastric bypass, bleeding anastomotic ulcer in October 2020 (repeat EGD November 2021 showed healing of anastomotic ulcer), who presented to the hospital because of melena, fatigue and generalized weakness.      Assessment/Plan:   Principal Problem:   Melena Active Problems:   Essential hypertension   Gout   Sleep apnea   Morbid obesity (HCC)   Long-term current use of opiate analgesic   Chronic pain   History of Anastomotic ulcer S/P gastric bypass   Acute blood loss anemia   Body mass index is 43.9 kg/m.  (Morbid obesity)  Melena/acute GI bleeding, history of bleeding anastomotic ulcer: Keep NPO and continue IV fluids.  Continue IV Protonix.  Insert second peripheral IV.  Follow-up with gastroenterologist.  Acute blood loss anemia: Hemoglobin dropped from 8.4-6.4.  Transfuse 1 unit of packed red blood cells and monitor H&H.  Risk and benefits of blood transfusion was discussed with the patient and his wife at the bedside.  They agree to proceed with blood transfusion.  Hypertension: Hold antihypertensives for now  History of stroke: Hold Plavix  OSA on CPAP at night  Chronic pain/spinal stenosis: Analgesics as needed for pain   Diet Order             Diet NPO time specified Except for: Sips with Meds, Ice Chips  Diet effective now                      Consultants: Gastroenterologist  Procedures: None    Medications:    pantoprazole (PROTONIX) IV  40 mg Intravenous Q12H   Continuous Infusions:  sodium chloride 75 mL/hr at 08/13/21 2300      Anti-infectives (From admission, onward)    None              Family Communication/Anticipated D/C date and plan/Code Status   DVT prophylaxis: SCDs Start: 08/13/21 2142     Code Status: Full Code  Family Communication: Plan discussed with his wife at bedside Disposition Plan: Possible discharge home in 2 to 3 days   Status is: Inpatient  Remains inpatient appropriate because: IV fluids and plan for EGD.           Subjective:   Interval events noted.  He said he had 1 episode of black stools this morning.  His wife was at the bedside.   Objective:    Vitals:   08/14/21 0500 08/14/21 0530 08/14/21 0600 08/14/21 0700  BP: (!) 115/53 (!) 117/52 (!) 114/53 120/65  Pulse: (!) 57 60 64 72  Resp: 17 16 13 17   Temp:      TempSrc:      SpO2: 99% 98% 99% 97%  Weight:      Height:       No data found.   Intake/Output Summary (Last 24 hours) at 08/14/2021 0924 Last data filed at 08/13/2021 2259 Gross per 24 hour  Intake 999 ml  Output --  Net 999 ml   Filed Weights   08/13/21 1930  Weight: 123.4 kg  Exam:  GEN: NAD SKIN: Warm and dry EYES: Pale but anicteric ENT: MMM CV: RRR PULM: CTA B ABD: soft, obese, NT, +BS CNS: AAO x 3, non focal EXT: B/l leg edema (2+ ), no tenderness        Data Reviewed:   I have personally reviewed following labs and imaging studies:  Labs: Labs show the following:   Basic Metabolic Panel: Recent Labs  Lab 08/13/21 1946  NA 141  K 4.2  CL 109  CO2 27  GLUCOSE 138*  BUN 36*  CREATININE 0.90  CALCIUM 8.0*   GFR Estimated Creatinine Clearance: 89.2 mL/min (by C-G formula based on SCr of 0.9 mg/dL). Liver Function Tests: Recent Labs  Lab 08/13/21 1946  AST 20  ALT 14  ALKPHOS 87  BILITOT 0.6  PROT 5.7*  ALBUMIN 3.2*   Recent Labs  Lab 08/13/21 1946  LIPASE 23   No results for input(s): AMMONIA in the last 168 hours. Coagulation profile Recent Labs  Lab 08/13/21 1946   INR 1.2    CBC: Recent Labs  Lab 08/13/21 1946 08/14/21 0114 08/14/21 0622  WBC 7.9  --   --   HGB 8.1* 8.4* 6.4*  HCT 23.9* 23.9* 18.2*  MCV 88.5  --   --   PLT 244  --   --    Cardiac Enzymes: No results for input(s): CKTOTAL, CKMB, CKMBINDEX, TROPONINI in the last 168 hours. BNP (last 3 results) No results for input(s): PROBNP in the last 8760 hours. CBG: No results for input(s): GLUCAP in the last 168 hours. D-Dimer: No results for input(s): DDIMER in the last 72 hours. Hgb A1c: No results for input(s): HGBA1C in the last 72 hours. Lipid Profile: No results for input(s): CHOL, HDL, LDLCALC, TRIG, CHOLHDL, LDLDIRECT in the last 72 hours. Thyroid function studies: No results for input(s): TSH, T4TOTAL, T3FREE, THYROIDAB in the last 72 hours.  Invalid input(s): FREET3 Anemia work up: No results for input(s): VITAMINB12, FOLATE, FERRITIN, TIBC, IRON, RETICCTPCT in the last 72 hours. Sepsis Labs: Recent Labs  Lab 08/13/21 1946  WBC 7.9    Microbiology Recent Results (from the past 240 hour(s))  Resp Panel by RT-PCR (Flu A&B, Covid) Nasopharyngeal Swab     Status: None   Collection Time: 08/13/21  9:09 PM   Specimen: Nasopharyngeal Swab; Nasopharyngeal(NP) swabs in vial transport medium  Result Value Ref Range Status   SARS Coronavirus 2 by RT PCR NEGATIVE NEGATIVE Final    Comment: (NOTE) SARS-CoV-2 target nucleic acids are NOT DETECTED.  The SARS-CoV-2 RNA is generally detectable in upper respiratory specimens during the acute phase of infection. The lowest concentration of SARS-CoV-2 viral copies this assay can detect is 138 copies/mL. A negative result does not preclude SARS-Cov-2 infection and should not be used as the sole basis for treatment or other patient management decisions. A negative result may occur with  improper specimen collection/handling, submission of specimen other than nasopharyngeal swab, presence of viral mutation(s) within the areas  targeted by this assay, and inadequate number of viral copies(<138 copies/mL). A negative result must be combined with clinical observations, patient history, and epidemiological information. The expected result is Negative.  Fact Sheet for Patients:  EntrepreneurPulse.com.au  Fact Sheet for Healthcare Providers:  IncredibleEmployment.be  This test is no t yet approved or cleared by the [place] FDA and  has been authorized for detection and/or diagnosis of SARS-CoV-2 by FDA under an Emergency Use Authorization (EUA). This EUA will remain  in  effect (meaning this test can be used) for the duration of the COVID-19 declaration under Section 564(b)(1) of the Act, 21 U.S.C.section 360bbb-3(b)(1), unless the authorization is terminated  or revoked sooner.       Influenza A by PCR NEGATIVE NEGATIVE Final   Influenza B by PCR NEGATIVE NEGATIVE Final    Comment: (NOTE) The Xpert Xpress SARS-CoV-2/FLU/RSV plus assay is intended as an aid in the diagnosis of influenza from Nasopharyngeal swab specimens and should not be used as a sole basis for treatment. Nasal washings and aspirates are unacceptable for Xpert Xpress SARS-CoV-2/FLU/RSV testing.  Fact Sheet for Patients: EntrepreneurPulse.com.au  Fact Sheet for Healthcare Providers: IncredibleEmployment.be  This test is not yet approved or cleared by the [place] FDA and has been authorized for detection and/or diagnosis of SARS-CoV-2 by FDA under an Emergency Use Authorization (EUA). This EUA will remain in effect (meaning this test can be used) for the duration of the COVID-19 declaration under Section 564(b)(1) of the Act, 21 U.S.C. section 360bbb-3(b)(1), unless the authorization is terminated or revoked.  Performed at [facility], [address] [place], [place] [address]     Procedures and diagnostic studies:  No results  found.             LOS: 1 day   Christee Mervine  [facility]   Pager on [email or web address]. If 7PM-7AM, please contact night-coverage at [email or web address]     08/14/2021, 9:24 AM

## 2021-08-14 NOTE — ED Notes (Signed)
Pt provided water & secretary asked to request a hospital bed for the patient to transition him from the stretcher.

## 2021-08-15 ENCOUNTER — Inpatient Hospital Stay: Payer: Medicare Other | Admitting: Registered Nurse

## 2021-08-15 ENCOUNTER — Encounter
Admission: EM | Disposition: A | Discharge status: Home / Self Care | Payer: Self-pay | Source: Home / Self Care | Attending: Internal Medicine

## 2021-08-15 ENCOUNTER — Encounter: Payer: Self-pay | Admitting: Gastroenterology

## 2021-08-15 DIAGNOSIS — D62 Acute posthemorrhagic anemia: Secondary | ICD-10-CM | POA: Diagnosis not present

## 2021-08-15 DIAGNOSIS — K921 Melena: Secondary | ICD-10-CM | POA: Diagnosis not present

## 2021-08-15 HISTORY — PX: ESOPHAGOGASTRODUODENOSCOPY (EGD) WITH PROPOFOL: SHX5813

## 2021-08-15 LAB — H PYLORI, IGM, IGG, IGA AB
H Pylori IgG: 0.21 Index Value (ref 0.00–0.79)
H. Pylogi, Iga Abs: <9 units (ref 0.0–8.9)
H. Pylogi, Igm Abs: <9 units (ref 0.0–8.9)

## 2021-08-15 LAB — CBC
HCT: 19.3 % — ABNORMAL LOW (ref 39.0–52.0)
Hemoglobin: 6.5 g/dL — ABNORMAL LOW (ref 13.0–17.0)
MCH: 29.7 pg (ref 26.0–34.0)
MCHC: 33.7 g/dL (ref 30.0–36.0)
MCV: 88.1 fL (ref 80.0–100.0)
Platelets: 162 10*3/uL (ref 150–400)
RBC: 2.19 MIL/uL — ABNORMAL LOW (ref 4.22–5.81)
RDW: 14.5 % (ref 11.5–15.5)
WBC: 4.8 10*3/uL (ref 4.0–10.5)
nRBC: 0 % (ref 0.0–0.2)

## 2021-08-15 LAB — HEMOGLOBIN AND HEMATOCRIT, BLOOD
HCT: 24.9 % — ABNORMAL LOW (ref 39.0–52.0)
Hemoglobin: 8.3 g/dL — ABNORMAL LOW (ref 13.0–17.0)

## 2021-08-15 LAB — BRAIN NATRIURETIC PEPTIDE: B Natriuretic Peptide: 133.6 pg/mL — ABNORMAL HIGH (ref 0.0–100.0)

## 2021-08-15 LAB — PREPARE RBC (CROSSMATCH)

## 2021-08-15 SURGERY — ESOPHAGOGASTRODUODENOSCOPY (EGD) WITH PROPOFOL
Anesthesia: General

## 2021-08-15 MED ORDER — PROPOFOL 10 MG/ML IV BOLUS
INTRAVENOUS | Status: DC | PRN
  Administered 2021-08-15: 120 mg via INTRAVENOUS

## 2021-08-15 MED ORDER — FENTANYL CITRATE (PF) 100 MCG/2ML IJ SOLN
INTRAMUSCULAR | Status: DC | PRN
  Administered 2021-08-15: 50 ug via INTRAVENOUS

## 2021-08-15 MED ORDER — LIDOCAINE HCL (CARDIAC) PF 100 MG/5ML IV SOSY
PREFILLED_SYRINGE | INTRAVENOUS | Status: DC | PRN
  Administered 2021-08-15: 50 mg via INTRAVENOUS

## 2021-08-15 MED ORDER — GLYCOPYRROLATE 0.2 MG/ML IJ SOLN
INTRAMUSCULAR | Status: DC | PRN
  Administered 2021-08-15: .2 mg via INTRAVENOUS

## 2021-08-15 MED ORDER — FENTANYL CITRATE (PF) 100 MCG/2ML IJ SOLN
INTRAMUSCULAR | Status: AC
  Filled 2021-08-15: qty 2

## 2021-08-15 MED ORDER — PROPOFOL 10 MG/ML IV BOLUS
INTRAVENOUS | Status: AC
  Filled 2021-08-15: qty 40

## 2021-08-15 MED ORDER — SODIUM CHLORIDE 0.9 % IV SOLN
200.0000 mg | Freq: Once | INTRAVENOUS | Status: AC
  Administered 2021-08-15: 200 mg via INTRAVENOUS
  Filled 2021-08-15: qty 200

## 2021-08-15 MED ORDER — SODIUM CHLORIDE 0.9 % IV SOLN: INTRAVENOUS | Status: DC

## 2021-08-15 MED ORDER — SODIUM CHLORIDE 0.9% IV SOLUTION
Freq: Once | INTRAVENOUS | Status: DC
  Filled 2021-08-15: qty 250

## 2021-08-15 MED ORDER — FUROSEMIDE 10 MG/ML IJ SOLN
20.0000 mg | Freq: Once | INTRAMUSCULAR | Status: AC
  Administered 2021-08-15: 20 mg via INTRAVENOUS
  Filled 2021-08-15: qty 4

## 2021-08-15 NOTE — Op Note (Signed)
[facility] Gastroenterology Patient Name: Randall [name] Procedure Date: 08/15/2021 1:47 PM MRN: [id number] Account #: [id number] Date of Birth: [birth year] Admit Type: Inpatient Age: 75 Room: [facility] ENDO ROOM 1 Gender: Male Note Status: Finalized Instrument Name: Upper Endoscope [id number] Procedure:             Upper GI endoscopy Indications:           Melena Providers:             Lucilla Lame MD, MD Referring MD:          Ramonita Lab, MD (Referring MD) Medicines:             Propofol per Anesthesia Complications:         No immediate complications. Procedure:             Pre-Anesthesia Assessment:                        - Prior to the procedure, a History and Physical was                         performed, and patient medications and allergies were                         reviewed. The patient's tolerance of previous                         anesthesia was also reviewed. The risks and benefits                         of the procedure and the sedation options and risks                         were discussed with the patient. All questions were                         answered, and informed consent was obtained. Prior                         Anticoagulants: The patient has taken Plavix                         (clopidogrel), last dose was 3 days prior to                         procedure. ASA Grade Assessment: IV - A patient with                         severe systemic disease that is a constant threat to                         life. After reviewing the risks and benefits, the                         patient was deemed in satisfactory condition to                         undergo the procedure.  After obtaining informed consent, the endoscope was                         passed under direct vision. Throughout the procedure,                         the patient's blood pressure, pulse, and oxygen                         saturations were monitored  continuously. The Endoscope                         was introduced through the mouth, and advanced to the                         jejunum. The upper GI endoscopy was accomplished                         without difficulty. The patient tolerated the                         procedure well. Findings:      The examined esophagus was normal.      The examined jejunum was normal.      Evidence of a gastric bypass was found. A gastric pouch was found. The       gastrojejunal anastomosis was characterized by ulceration. This was       traversed. Impression:            - Normal esophagus.                        - Normal examined jejunum.                        - Gastric bypass. Gastrojejunal anastomosis                         characterized by ulceration.                        - No specimens collected. Recommendation:        - Return patient to hospital ward for ongoing care.                        - Clear liquid diet.                        - Continue present medications. Procedure Code(s):     --- Professional ---                        [phone], Esophagogastroduodenoscopy, flexible,                         transoral; diagnostic, including collection of                         specimen(s) by brushing or washing, when performed                         (separate procedure) Diagnosis Code(s):     ---  Professional ---                        K92.1, Melena (includes Hematochezia) CPT copyright 2019 American Medical Association. All rights reserved. The codes documented in this report are preliminary and upon coder review may  be revised to meet current compliance requirements. Lucilla Lame MD, MD 08/15/2021 2:34:13 PM This report has been signed electronically. Number of Addenda: 0 Note Initiated On: 08/15/2021 1:47 PM Estimated Blood Loss:  Estimated blood loss: none.      [facility]

## 2021-08-15 NOTE — ED Notes (Signed)
Lab contacted to obtain new lab work.

## 2021-08-15 NOTE — ED Notes (Signed)
Dark red bowel movement x1, denies dizziness.

## 2021-08-15 NOTE — Anesthesia Preprocedure Evaluation (Signed)
Anesthesia Evaluation  Patient identified by MRN, date of birth, ID band Patient awake    Reviewed: Allergy & Precautions, NPO status , Patient's Chart, lab work & pertinent test results  Airway Mallampati: III  TM Distance: >3 FB Neck ROM: full    Dental  (+) Partial Lower   Pulmonary neg pulmonary ROS, sleep apnea and Continuous Positive Airway Pressure Ventilation ,    Pulmonary exam normal  + decreased breath sounds      Cardiovascular Exercise Tolerance: Poor hypertension, Pt. on medications + Peripheral Vascular Disease  negative cardio ROS Normal cardiovascular exam Rhythm:Regular Rate:Tachycardia     Neuro/Psych CVA negative neurological ROS  negative psych ROS   GI/Hepatic negative GI ROS, Neg liver ROS, GERD  Medicated,  Endo/Other  negative endocrine ROS  Renal/GU Renal diseasenegative Renal ROS  negative genitourinary   Musculoskeletal  (+) Arthritis ,   Abdominal (+) + obese,   Peds  Hematology negative hematology ROS (+) Sickle cell trait ,   Anesthesia Other Findings Past Medical History: No date: Allergy No date: Anemia No date: Arthritis No date: Cataract No date: Chronic kidney disease     Comment:  Kidney stone No date: Clotting disorder ([place]) No date: Deep vein blood clot of left lower extremity ([place]) No date: GERD (gastroesophageal reflux disease) No date: Gout No date: Hypertension 2014: Melanoma ([place])     Comment:  forehead No date: Sleep apnea     Comment:  cpap - uses it nightly No date: Stroke [facility])     Comment:  TIA  Past Surgical History: 2005: BACK SURGERY     Comment:  spinal stenosis 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 10/30/2017: CARPAL TUNNEL RELEASE; Left 07/29/2019: COLONOSCOPY     Comment:  [place] 11/15/2013: Eye lid surgery; Bilateral 2014: GASTRIC BYPASS No date: POLYPECTOMY 02/27/2014: removal melanoma     Comment:  forehead 07/2013: ROUX-EN-Y GASTRIC BYPASS No  date: SHOULDER ARTHROSCOPY W/ ROTATOR CUFF REPAIR; Right     Comment:  x2 11/21/2016: SHOULDER SURGERY; Right     Comment:  REVERSE SHOULDER 2006: TOTAL KNEE ARTHROPLASTY; Left 10/31/2014: TOTAL KNEE ARTHROPLASTY; Right 09/08/2019: UPPER GASTROINTESTINAL ENDOSCOPY     Comment:  [name] - ULCER  BMI    Body Mass Index: 43.90 kg/m      Reproductive/Obstetrics negative OB ROS                             Anesthesia Physical Anesthesia Plan  ASA: 4 and emergent  Anesthesia Plan: General   Post-op Pain Management:    Induction: Intravenous  PONV Risk Score and Plan: Propofol infusion and TIVA  Airway Management Planned: Nasal Cannula  Additional Equipment:   Intra-op Plan:   Post-operative Plan:   Informed Consent: I have reviewed the patients History and Physical, chart, labs and discussed the procedure including the risks, benefits and alternatives for the proposed anesthesia with the patient or authorized representative who has indicated his/her understanding and acceptance.     Dental Advisory Given  Plan Discussed with: CRNA and Surgeon  Anesthesia Plan Comments:         Anesthesia Quick Evaluation

## 2021-08-15 NOTE — ED Notes (Signed)
Lab at bedside

## 2021-08-15 NOTE — ED Notes (Signed)
Pt repositioned in bed and able to use urinal with assistance.

## 2021-08-15 NOTE — ED Notes (Signed)
Lab called, unable to run CBC d/t clot, requested recollect. Done and sent

## 2021-08-15 NOTE — Progress Notes (Signed)
Progress Note    Randall [name]  FIE:[id number] DOB: [birth year]  DOA: 08/13/2021 PCP: Adin Hector, MD      Brief Narrative:    Medical records reviewed and are as summarized below:  Randall [name] is a 75 y.o. male with medical history significant for chronic pain on opiates, hypertension, history of stroke on Plavix, history of DVT left lower extremity, gout, OSA on CPAP, morbid obesity, s/p gastric bypass, bleeding anastomotic ulcer in October 2020 (repeat EGD November 2021 showed healing of anastomotic ulcer), who presented to the hospital because of melena, fatigue and generalized weakness.      Assessment/Plan:   Principal Problem:   Melena Active Problems:   Essential hypertension   Gout   Sleep apnea   Morbid obesity (HCC)   Long-term current use of opiate analgesic   Chronic pain   History of Anastomotic ulcer S/P gastric bypass   Acute blood loss anemia   Body mass index is 43.9 kg/m.  (Morbid obesity)  Melena/acute GI bleeding, history of bleeding anastomotic ulcer: Keep NPO and continue IV fluids.  Continue IV Protonix.  Plan for EGD today.  Follow-up with gastroenterologist.    Acute blood loss anemia, iron deficiency anemia: Hemoglobin dropped again to 6.5.  He was transfused with a second unit of PRBCs today.  S/p transfusion with 1 unit of PRBCs on 08/14/2021.  Give 1 dose of IV iron sucrose today.  Continue to monitor H&H and transfuse as needed.  Give 1 dose of IV Lasix post blood transfusion.  BNP was 133.6.  Hypertension: Hold antihypertensives for now  History of stroke: Hold Plavix  OSA on CPAP at night  Chronic pain/spinal stenosis: Analgesics as needed for pain   Diet Order             Diet NPO time specified  Diet effective now                      Consultants: Gastroenterologist  Procedures: None    Medications:    [MAR Hold] sodium chloride   Intravenous Once   [MAR Hold] pantoprazole (PROTONIX) IV  40  mg Intravenous Q12H   Continuous Infusions:  sodium chloride 75 mL/hr at 08/15/21 1117   sodium chloride 20 mL/hr at 08/15/21 1400     Anti-infectives (From admission, onward)    None              Family Communication/Anticipated D/C date and plan/Code Status   DVT prophylaxis: SCDs Start: 08/13/21 2142     Code Status: Full Code  Family Communication: Plan discussed with his wife at bedside Disposition Plan: Possible discharge home in 1 to 2 days    Status is: Inpatient  Remains inpatient appropriate because: IV fluids and plan for EGD.           Subjective:   Interval events noted.  He said he had 2 bloody stools overnight.  He feels weak and tired.  His wife was at the bedside.   Objective:    Vitals:   08/15/21 0922 08/15/21 1130 08/15/21 1315 08/15/21 1359  BP: (!) 144/65 (!) 155/69  (!) 151/75  Pulse: (!) 59 (!) 59 68 60  Resp: 20 16 16 18   Temp: 98.8 F (37.1 C)   (!) 97.3 F (36.3 C)  TempSrc: Oral   Temporal  SpO2: 99% 98% 97% 97%  Weight:      Height:  No data found.   Intake/Output Summary (Last 24 hours) at 08/15/2021 1406 Last data filed at 08/15/2021 1140 Gross per 24 hour  Intake 90 ml  Output --  Net 90 ml   Filed Weights   08/13/21 1930  Weight: 123.4 kg    Exam:  GEN: NAD SKIN: Warm and dry EYES: Pale but anicteric ENT: MMM CV: RRR PULM: CTA B ABD: soft, obese, NT, +BS CNS: AAO x 3, non focal EXT: Bilateral leg edema (2+).  No erythema or tenderness        Data Reviewed:   I have personally reviewed following labs and imaging studies:  Labs: Labs show the following:   Basic Metabolic Panel: Recent Labs  Lab 08/13/21 1946  NA 141  K 4.2  CL 109  CO2 27  GLUCOSE 138*  BUN 36*  CREATININE 0.90  CALCIUM 8.0*   GFR Estimated Creatinine Clearance: 89.2 mL/min (by C-G formula based on SCr of 0.9 mg/dL). Liver Function Tests: Recent Labs  Lab 08/13/21 1946  AST 20  ALT 14   ALKPHOS 87  BILITOT 0.6  PROT 5.7*  ALBUMIN 3.2*   Recent Labs  Lab 08/13/21 1946  LIPASE 23   No results for input(s): AMMONIA in the last 168 hours. Coagulation profile Recent Labs  Lab 08/13/21 1946  INR 1.2    CBC: Recent Labs  Lab 08/13/21 1946 08/14/21 0114 08/14/21 0622 08/14/21 1529 08/15/21 0554 08/15/21 1204  WBC 7.9  --   --  5.9 4.8  --   NEUTROABS  --   --   --  3.6  --   --   HGB 8.1* 8.4* 6.4* 7.0* 6.5* 8.3*  HCT 23.9* 23.9* 18.2* 20.3* 19.3* 24.9*  MCV 88.5  --   --  87.9 88.1  --   PLT 244  --   --  169 162  --    Cardiac Enzymes: No results for input(s): CKTOTAL, CKMB, CKMBINDEX, TROPONINI in the last 168 hours. BNP (last 3 results) No results for input(s): PROBNP in the last 8760 hours. CBG: No results for input(s): GLUCAP in the last 168 hours. D-Dimer: No results for input(s): DDIMER in the last 72 hours. Hgb A1c: No results for input(s): HGBA1C in the last 72 hours. Lipid Profile: No results for input(s): CHOL, HDL, LDLCALC, TRIG, CHOLHDL, LDLDIRECT in the last 72 hours. Thyroid function studies: No results for input(s): TSH, T4TOTAL, T3FREE, THYROIDAB in the last 72 hours.  Invalid input(s): FREET3 Anemia work up: Recent Labs    08/14/21 0622  FERRITIN 29  TIBC 265  IRON 43*   Sepsis Labs: Recent Labs  Lab 08/13/21 1946 08/14/21 1529 08/15/21 0554  WBC 7.9 5.9 4.8    Microbiology Recent Results (from the past 240 hour(s))  Resp Panel by RT-PCR (Flu A&B, Covid) Nasopharyngeal Swab     Status: None   Collection Time: 08/13/21  9:09 PM   Specimen: Nasopharyngeal Swab; Nasopharyngeal(NP) swabs in vial transport medium  Result Value Ref Range Status   SARS Coronavirus 2 by RT PCR NEGATIVE NEGATIVE Final    Comment: (NOTE) SARS-CoV-2 target nucleic acids are NOT DETECTED.  The SARS-CoV-2 RNA is generally detectable in upper respiratory specimens during the acute phase of infection. The lowest concentration of SARS-CoV-2  viral copies this assay can detect is 138 copies/mL. A negative result does not preclude SARS-Cov-2 infection and should not be used as the sole basis for treatment or other patient management decisions. A negative result may  occur with  improper specimen collection/handling, submission of specimen other than nasopharyngeal swab, presence of viral mutation(s) within the areas targeted by this assay, and inadequate number of viral copies(<138 copies/mL). A negative result must be combined with clinical observations, patient history, and epidemiological information. The expected result is Negative.  Fact Sheet for Patients:  EntrepreneurPulse.com.au  Fact Sheet for Healthcare Providers:  IncredibleEmployment.be  This test is no t yet approved or cleared by the [place] FDA and  has been authorized for detection and/or diagnosis of SARS-CoV-2 by FDA under an Emergency Use Authorization (EUA). This EUA will remain  in effect (meaning this test can be used) for the duration of the COVID-19 declaration under Section 564(b)(1) of the Act, 21 U.S.C.section 360bbb-3(b)(1), unless the authorization is terminated  or revoked sooner.       Influenza A by PCR NEGATIVE NEGATIVE Final   Influenza B by PCR NEGATIVE NEGATIVE Final    Comment: (NOTE) The Xpert Xpress SARS-CoV-2/FLU/RSV plus assay is intended as an aid in the diagnosis of influenza from Nasopharyngeal swab specimens and should not be used as a sole basis for treatment. Nasal washings and aspirates are unacceptable for Xpert Xpress SARS-CoV-2/FLU/RSV testing.  Fact Sheet for Patients: EntrepreneurPulse.com.au  Fact Sheet for Healthcare Providers: IncredibleEmployment.be  This test is not yet approved or cleared by the [place] FDA and has been authorized for detection and/or diagnosis of SARS-CoV-2 by FDA under an Emergency Use Authorization  (EUA). This EUA will remain in effect (meaning this test can be used) for the duration of the COVID-19 declaration under Section 564(b)(1) of the Act, 21 U.S.C. section 360bbb-3(b)(1), unless the authorization is terminated or revoked.  Performed at [facility], [address] [place], [place] [address]     Procedures and diagnostic studies:  No results found.             LOS: 2 days   Geraldin Habermehl  [facility]   Pager on [email or web address]. If 7PM-7AM, please contact night-coverage at [email or web address]     08/15/2021, 2:06 PM

## 2021-08-15 NOTE — ED Notes (Signed)
Noticed no labs ordered for 0500 lab draw, paged Dr. Tamala Julian and updated about this and x2 dark BMs in nighttime. New orders for CBC stat provided

## 2021-08-15 NOTE — ED Notes (Signed)
Pt resting comfortably with personal CPAP on.

## 2021-08-15 NOTE — Transfer of Care (Signed)
Immediate Anesthesia Transfer of Care Note  Patient: Randall [name]  Procedure(s) Performed: ESOPHAGOGASTRODUODENOSCOPY (EGD) WITH PROPOFOL  Patient Location: PACU  Anesthesia Type:MAC  Level of Consciousness: drowsy  Airway & Oxygen Therapy: Patient Spontanous Breathing  Post-op Assessment: Report given to RN and Post -op Vital signs reviewed and stable  Post vital signs: stable  Last Vitals:  Vitals Value Taken Time  BP 111/46 08/15/21 1440  Temp 36.6 C 08/15/21 1440  Pulse 57 08/15/21 1442  Resp 14 08/15/21 1442  SpO2 95 % 08/15/21 1442  Vitals shown include unvalidated device data.  Last Pain:  Vitals:   08/15/21 1440  TempSrc: Temporal  PainSc: Asleep         Complications: No notable events documented.

## 2021-08-15 NOTE — ED Notes (Signed)
Robin, RN from endo here to get pt.

## 2021-08-15 NOTE — ED Notes (Signed)
CBC results communicated to Dr.Smith, plan will be to transfuse additional unit, see orders

## 2021-08-16 DIAGNOSIS — T85898A Other specified complication of other internal prosthetic devices, implants and grafts, initial encounter: Secondary | ICD-10-CM | POA: Diagnosis not present

## 2021-08-16 DIAGNOSIS — K921 Melena: Secondary | ICD-10-CM | POA: Diagnosis not present

## 2021-08-16 DIAGNOSIS — K289 Gastrojejunal ulcer, unspecified as acute or chronic, without hemorrhage or perforation: Secondary | ICD-10-CM | POA: Diagnosis not present

## 2021-08-16 DIAGNOSIS — D62 Acute posthemorrhagic anemia: Secondary | ICD-10-CM | POA: Diagnosis not present

## 2021-08-16 LAB — CBC WITH DIFFERENTIAL/PLATELET
Abs Immature Granulocytes: 0.02 10*3/uL (ref 0.00–0.07)
Basophils Absolute: 0.1 10*3/uL (ref 0.0–0.1)
Basophils Relative: 1 %
Eosinophils Absolute: 0.3 10*3/uL (ref 0.0–0.5)
Eosinophils Relative: 6 %
HCT: 22.6 % — ABNORMAL LOW (ref 39.0–52.0)
Hemoglobin: 7.6 g/dL — ABNORMAL LOW (ref 13.0–17.0)
Immature Granulocytes: 0 %
Lymphocytes Relative: 27 %
Lymphs Abs: 1.5 10*3/uL (ref 0.7–4.0)
MCH: 28.9 pg (ref 26.0–34.0)
MCHC: 33.6 g/dL (ref 30.0–36.0)
MCV: 85.9 fL (ref 80.0–100.0)
Monocytes Absolute: 0.4 10*3/uL (ref 0.1–1.0)
Monocytes Relative: 8 %
Neutro Abs: 3.1 10*3/uL (ref 1.7–7.7)
Neutrophils Relative %: 58 %
Platelets: 184 10*3/uL (ref 150–400)
RBC: 2.63 MIL/uL — ABNORMAL LOW (ref 4.22–5.81)
RDW: 14.5 % (ref 11.5–15.5)
WBC: 5.4 10*3/uL (ref 4.0–10.5)
nRBC: 0 % (ref 0.0–0.2)

## 2021-08-16 LAB — TYPE AND SCREEN
ABO/RH(D): A POS
Antibody Screen: NEGATIVE
Unit division: 0
Unit division: 0

## 2021-08-16 LAB — BASIC METABOLIC PANEL
Anion gap: 4 — ABNORMAL LOW (ref 5–15)
BUN: 11 mg/dL (ref 8–23)
CO2: 27 mmol/L (ref 22–32)
Calcium: 7.8 mg/dL — ABNORMAL LOW (ref 8.9–10.3)
Chloride: 109 mmol/L (ref 98–111)
Creatinine, Ser: 0.74 mg/dL (ref 0.61–1.24)
GFR, Estimated: >60 mL/min (ref >60)
Glucose, Bld: 89 mg/dL (ref 70–99)
Potassium: 3.1 mmol/L — ABNORMAL LOW (ref 3.5–5.1)
Sodium: 140 mmol/L (ref 135–145)

## 2021-08-16 LAB — BPAM RBC
Blood Product Expiration Date: 202211162359
Blood Product Expiration Date: 202211172359
ISSUE DATE / TIME: 202210261021
ISSUE DATE / TIME: 202210270646
Unit Type and Rh: 6200
Unit Type and Rh: 6200

## 2021-08-16 MED ORDER — SODIUM CHLORIDE 0.9 % IV SOLN
200.0000 mg | Freq: Once | INTRAVENOUS | Status: AC
  Administered 2021-08-16: 200 mg via INTRAVENOUS
  Filled 2021-08-16: qty 200

## 2021-08-16 MED ORDER — POTASSIUM CHLORIDE CRYS ER 20 MEQ PO TBCR
40.0000 meq | EXTENDED_RELEASE_TABLET | Freq: Once | ORAL | Status: AC
  Administered 2021-08-16: 40 meq via ORAL
  Filled 2021-08-16: qty 2

## 2021-08-16 NOTE — Progress Notes (Addendum)
Progress Note    Randall [name]  [name] DOB: [birth year]  DOA: 08/13/2021 PCP: Randall Hector, MD      Brief Narrative:    Medical records reviewed and are as summarized below:  Randall [name] is a 75 y.o. male with medical history significant for chronic pain on opiates, hypertension, history of stroke on Plavix, history of DVT left lower extremity, gout, OSA on CPAP, morbid obesity, s/p gastric bypass, bleeding anastomotic ulcer in October 2020 (repeat EGD November 2021 showed healing of anastomotic ulcer), who presented to the hospital because of melena, fatigue and generalized weakness.      Assessment/Plan:   Principal Problem:   Melena Active Problems:   Essential hypertension   Gout   Sleep apnea   Morbid obesity (HCC)   Long-term current use of opiate analgesic   Chronic pain   History of Anastomotic ulcer S/P gastric bypass   Acute blood loss anemia   Body mass index is 43.9 kg/m.  (Morbid obesity)  Melena/acute GI bleeding, history of bleeding anastomotic ulcer: S/p EGD on 08/15/2021 which showed gastric bypass and gastrojejunal anastomosis characterized by ulceration. He is tolerating clear liquid diet.  Discontinue IV fluids.  Continue IV Protonix.  Follow-up with gastroenterologist for further recommendations.  Acute blood loss anemia, iron deficiency anemia: S/p transfusion with 2 units of packed red blood cells.  S/p treatment with IV iron sucrose on 08/15/2021.  Repeat IV iron sucrose today.  Monitor H&H and transfuse as needed.    Hypertension: Hold antihypertensives for now  History of stroke: Hold Plavix  OSA on CPAP at night  Chronic pain/spinal stenosis: Analgesics as needed for pain   Diet Order             Diet heart healthy/carb modified Room service appropriate? Yes; Fluid consistency: Thin  Diet effective now                      Consultants: Gastroenterologist  Procedures: EGD on  08/15/2021    Medications:    sodium chloride   Intravenous Once   pantoprazole (PROTONIX) IV  40 mg Intravenous Q12H   Continuous Infusions:     Anti-infectives (From admission, onward)    None              Family Communication/Anticipated D/C date and plan/Code Status   DVT prophylaxis: SCDs Start: 08/13/21 2142     Code Status: Full Code  Family Communication: None Disposition Plan: Possible discharge to home tomorrow    Status is: Inpatient  Remains inpatient appropriate because: Monitor for rebleeding        Subjective:   Interval events noted.  He said he had purple-colored stools overnight.    Objective:    Vitals:   08/16/21 0430 08/16/21 0744 08/16/21 1105 08/16/21 1518  BP: (!) 148/70 (!) 150/72 (!) 146/70 (!) 147/68  Pulse: (!) 54 65 62 (!) 55  Resp: 16 17 18 18   Temp: 98.2 F (36.8 C) 98 F (36.7 C) 98 F (36.7 C) 98.2 F (36.8 C)  TempSrc: Oral Oral    SpO2: 98% 97% 97% 95%  Weight:      Height:       No data found.   Intake/Output Summary (Last 24 hours) at 08/16/2021 1528 Last data filed at 08/16/2021 1516 Gross per 24 hour  Intake 5076.33 ml  Output 750 ml  Net 4326.33 ml   Filed Weights   08/13/21 1930  Weight: 123.4 kg    Exam:  GEN: NAD SKIN: Warm and dry EYES: No pallor or icterus ENT: MMM CV: RRR PULM: CTA B ABD: soft, obese, NT, +BS CNS: AAO x 3, non focal EXT: Trace bilateral leg edema.  No erythema or tenderness       Data Reviewed:   I have personally reviewed following labs and imaging studies:  Labs: Labs show the following:   Basic Metabolic Panel: Recent Labs  Lab 08/13/21 1946 08/16/21 0515  NA 141 140  K 4.2 3.1*  CL 109 109  CO2 27 27  GLUCOSE 138* 89  BUN 36* 11  CREATININE 0.90 0.74  CALCIUM 8.0* 7.8*   GFR Estimated Creatinine Clearance: 100.4 mL/min (by C-G formula based on SCr of 0.74 mg/dL). Liver Function Tests: Recent Labs  Lab 08/13/21 1946  AST  20  ALT 14  ALKPHOS 87  BILITOT 0.6  PROT 5.7*  ALBUMIN 3.2*   Recent Labs  Lab 08/13/21 1946  LIPASE 23   No results for input(s): AMMONIA in the last 168 hours. Coagulation profile Recent Labs  Lab 08/13/21 1946  INR 1.2    CBC: Recent Labs  Lab 08/13/21 1946 08/14/21 0114 08/14/21 0622 08/14/21 1529 08/15/21 0554 08/15/21 1204 08/16/21 0515  WBC 7.9  --   --  5.9 4.8  --  5.4  NEUTROABS  --   --   --  3.6  --   --  3.1  HGB 8.1*   < > 6.4* 7.0* 6.5* 8.3* 7.6*  HCT 23.9*   < > 18.2* 20.3* 19.3* 24.9* 22.6*  MCV 88.5  --   --  87.9 88.1  --  85.9  PLT 244  --   --  169 162  --  184   < > = values in this interval not displayed.   Cardiac Enzymes: No results for input(s): CKTOTAL, CKMB, CKMBINDEX, TROPONINI in the last 168 hours. BNP (last 3 results) No results for input(s): PROBNP in the last 8760 hours. CBG: No results for input(s): GLUCAP in the last 168 hours. D-Dimer: No results for input(s): DDIMER in the last 72 hours. Hgb A1c: No results for input(s): HGBA1C in the last 72 hours. Lipid Profile: No results for input(s): CHOL, HDL, LDLCALC, TRIG, CHOLHDL, LDLDIRECT in the last 72 hours. Thyroid function studies: No results for input(s): TSH, T4TOTAL, T3FREE, THYROIDAB in the last 72 hours.  Invalid input(s): FREET3 Anemia work up: Recent Labs    08/14/21 0622  FERRITIN 29  TIBC 265  IRON 43*   Sepsis Labs: Recent Labs  Lab 08/13/21 1946 08/14/21 1529 08/15/21 0554 08/16/21 0515  WBC 7.9 5.9 4.8 5.4    Microbiology Recent Results (from the past 240 hour(s))  Resp Panel by RT-PCR (Flu A&B, Covid) Nasopharyngeal Swab     Status: None   Collection Time: 08/13/21  9:09 PM   Specimen: Nasopharyngeal Swab; Nasopharyngeal(NP) swabs in vial transport medium  Result Value Ref Range Status   SARS Coronavirus 2 by RT PCR NEGATIVE NEGATIVE Final    Comment: (NOTE) SARS-CoV-2 target nucleic acids are NOT DETECTED.  The SARS-CoV-2 RNA is  generally detectable in upper respiratory specimens during the acute phase of infection. The lowest concentration of SARS-CoV-2 viral copies this assay can detect is 138 copies/mL. A negative result does not preclude SARS-Cov-2 infection and should not be used as the sole basis for treatment or other patient management decisions. A negative result may occur with  improper specimen  collection/handling, submission of specimen other than nasopharyngeal swab, presence of viral mutation(s) within the areas targeted by this assay, and inadequate number of viral copies(<138 copies/mL). A negative result must be combined with clinical observations, patient history, and epidemiological information. The expected result is Negative.  Fact Sheet for Patients:  EntrepreneurPulse.com.au  Fact Sheet for Healthcare Providers:  IncredibleEmployment.be  This test is no t yet approved or cleared by the [place] FDA and  has been authorized for detection and/or diagnosis of SARS-CoV-2 by FDA under an Emergency Use Authorization (EUA). This EUA will remain  in effect (meaning this test can be used) for the duration of the COVID-19 declaration under Section 564(b)(1) of the Act, 21 U.S.C.section 360bbb-3(b)(1), unless the authorization is terminated  or revoked sooner.       Influenza A by PCR NEGATIVE NEGATIVE Final   Influenza B by PCR NEGATIVE NEGATIVE Final    Comment: (NOTE) The Xpert Xpress SARS-CoV-2/FLU/RSV plus assay is intended as an aid in the diagnosis of influenza from Nasopharyngeal swab specimens and should not be used as a sole basis for treatment. Nasal washings and aspirates are unacceptable for Xpert Xpress SARS-CoV-2/FLU/RSV testing.  Fact Sheet for Patients: EntrepreneurPulse.com.au  Fact Sheet for Healthcare Providers: IncredibleEmployment.be  This test is not yet approved or cleared by the [place] FDA and has been authorized for detection and/or diagnosis of SARS-CoV-2 by FDA under an Emergency Use Authorization (EUA). This EUA will remain in effect (meaning this test can be used) for the duration of the COVID-19 declaration under Section 564(b)(1) of the Act, 21 U.S.C. section 360bbb-3(b)(1), unless the authorization is terminated or revoked.  Performed at [facility], [address] [place], [place] [address]     Procedures and diagnostic studies:  No results found.             LOS: 3 days   Korin Hartwell  [facility]   Pager on [email or web address]. If 7PM-7AM, please contact night-coverage at [email or web address]     08/16/2021, 3:28 PM

## 2021-08-16 NOTE — Plan of Care (Signed)
  Problem: Education: Goal: Knowledge of General Education information will improve Description: Including pain rating scale, medication(s)/side effects and non-pharmacologic comfort measures 08/16/2021 1237 by Cristela Blue, RN Outcome: Progressing 08/16/2021 1237 by Cristela Blue, RN Outcome: Progressing   Problem: Health Behavior/Discharge Planning: Goal: Ability to manage health-related needs will improve 08/16/2021 1237 by Cristela Blue, RN Outcome: Progressing 08/16/2021 1237 by Cristela Blue, RN Outcome: Progressing   Problem: Clinical Measurements: Goal: Ability to maintain clinical measurements within normal limits will improve 08/16/2021 1237 by Cristela Blue, RN Outcome: Progressing 08/16/2021 1237 by Cristela Blue, RN Outcome: Progressing Goal: Will remain free from infection 08/16/2021 1237 by Cristela Blue, RN Outcome: Progressing 08/16/2021 1237 by Cristela Blue, RN Outcome: Progressing Goal: Diagnostic test results will improve 08/16/2021 1237 by Cristela Blue, RN Outcome: Progressing 08/16/2021 1237 by Cristela Blue, RN Outcome: Progressing Goal: Respiratory complications will improve 08/16/2021 1237 by Cristela Blue, RN Outcome: Progressing 08/16/2021 1237 by Cristela Blue, RN Outcome: Progressing Goal: Cardiovascular complication will be avoided 08/16/2021 1237 by Cristela Blue, RN Outcome: Progressing 08/16/2021 1237 by Cristela Blue, RN Outcome: Progressing   Problem: Activity: Goal: Risk for activity intolerance will decrease 08/16/2021 1237 by Cristela Blue, RN Outcome: Progressing 08/16/2021 1237 by Cristela Blue, RN Outcome: Progressing   Problem: Nutrition: Goal: Adequate nutrition will be maintained 08/16/2021 1237 by Cristela Blue, RN Outcome: Progressing 08/16/2021 1237 by Cristela Blue, RN Outcome: Progressing   Problem: Coping: Goal: Level of anxiety will decrease 08/16/2021 1237 by Cristela Blue, RN Outcome:  Progressing 08/16/2021 1237 by Cristela Blue, RN Outcome: Progressing   Problem: Elimination: Goal: Will not experience complications related to bowel motility 08/16/2021 1237 by Cristela Blue, RN Outcome: Progressing 08/16/2021 1237 by Cristela Blue, RN Outcome: Progressing Goal: Will not experience complications related to urinary retention 08/16/2021 1237 by Cristela Blue, RN Outcome: Progressing 08/16/2021 1237 by Cristela Blue, RN Outcome: Progressing   Problem: Pain Managment: Goal: General experience of comfort will improve 08/16/2021 1237 by Cristela Blue, RN Outcome: Progressing 08/16/2021 1237 by Cristela Blue, RN Outcome: Progressing   Problem: Safety: Goal: Ability to remain free from injury will improve 08/16/2021 1237 by Cristela Blue, RN Outcome: Progressing 08/16/2021 1237 by Cristela Blue, RN Outcome: Progressing   Problem: Skin Integrity: Goal: Risk for impaired skin integrity will decrease 08/16/2021 1237 by Cristela Blue, RN Outcome: Progressing 08/16/2021 1237 by Cristela Blue, RN Outcome: Progressing   Problem: Education: Goal: Ability to identify signs and symptoms of gastrointestinal bleeding will improve 08/16/2021 1237 by Cristela Blue, RN Outcome: Progressing 08/16/2021 1237 by Cristela Blue, RN Outcome: Progressing   Problem: Bowel/Gastric: Goal: Will show no signs and symptoms of gastrointestinal bleeding 08/16/2021 1237 by Cristela Blue, RN Outcome: Progressing 08/16/2021 1237 by Cristela Blue, RN Outcome: Progressing   Problem: Fluid Volume: Goal: Will show no signs and symptoms of excessive bleeding 08/16/2021 1237 by Cristela Blue, RN Outcome: Progressing 08/16/2021 1237 by Cristela Blue, RN Outcome: Progressing   Problem: Clinical Measurements: Goal: Complications related to the disease process, condition or treatment will be avoided or minimized 08/16/2021 1237 by Cristela Blue, RN Outcome:  Progressing 08/16/2021 1237 by Cristela Blue, RN Outcome: Progressing

## 2021-08-17 DIAGNOSIS — D62 Acute posthemorrhagic anemia: Secondary | ICD-10-CM | POA: Diagnosis not present

## 2021-08-17 DIAGNOSIS — K289 Gastrojejunal ulcer, unspecified as acute or chronic, without hemorrhage or perforation: Secondary | ICD-10-CM | POA: Diagnosis not present

## 2021-08-17 DIAGNOSIS — K921 Melena: Secondary | ICD-10-CM | POA: Diagnosis not present

## 2021-08-17 DIAGNOSIS — T85898A Other specified complication of other internal prosthetic devices, implants and grafts, initial encounter: Secondary | ICD-10-CM | POA: Diagnosis not present

## 2021-08-17 LAB — CBC WITH DIFFERENTIAL/PLATELET
Abs Immature Granulocytes: 0.02 10*3/uL (ref 0.00–0.07)
Basophils Absolute: 0.1 10*3/uL (ref 0.0–0.1)
Basophils Relative: 1 %
Eosinophils Absolute: 0.3 10*3/uL (ref 0.0–0.5)
Eosinophils Relative: 6 %
HCT: 23.5 % — ABNORMAL LOW (ref 39.0–52.0)
Hemoglobin: 8 g/dL — ABNORMAL LOW (ref 13.0–17.0)
Immature Granulocytes: 0 %
Lymphocytes Relative: 23 %
Lymphs Abs: 1.2 10*3/uL (ref 0.7–4.0)
MCH: 30.4 pg (ref 26.0–34.0)
MCHC: 34 g/dL (ref 30.0–36.0)
MCV: 89.4 fL (ref 80.0–100.0)
Monocytes Absolute: 0.4 10*3/uL (ref 0.1–1.0)
Monocytes Relative: 7 %
Neutro Abs: 3.4 10*3/uL (ref 1.7–7.7)
Neutrophils Relative %: 63 %
Platelets: 196 10*3/uL (ref 150–400)
RBC: 2.63 MIL/uL — ABNORMAL LOW (ref 4.22–5.81)
RDW: 14.5 % (ref 11.5–15.5)
WBC: 5.3 10*3/uL (ref 4.0–10.5)
nRBC: 0 % (ref 0.0–0.2)

## 2021-08-17 LAB — BASIC METABOLIC PANEL
Anion gap: 8 (ref 5–15)
BUN: 11 mg/dL (ref 8–23)
CO2: 23 mmol/L (ref 22–32)
Calcium: 8.3 mg/dL — ABNORMAL LOW (ref 8.9–10.3)
Chloride: 109 mmol/L (ref 98–111)
Creatinine, Ser: 0.86 mg/dL (ref 0.61–1.24)
GFR, Estimated: >60 mL/min (ref >60)
Glucose, Bld: 97 mg/dL (ref 70–99)
Potassium: 3.7 mmol/L (ref 3.5–5.1)
Sodium: 140 mmol/L (ref 135–145)

## 2021-08-17 LAB — HEMOGLOBIN AND HEMATOCRIT, BLOOD
HCT: 23.1 % — ABNORMAL LOW (ref 39.0–52.0)
Hemoglobin: 7.7 g/dL — ABNORMAL LOW (ref 13.0–17.0)

## 2021-08-17 MED ORDER — HYDRALAZINE HCL 50 MG PO TABS
100.0000 mg | ORAL_TABLET | Freq: Two times a day (BID) | ORAL | Status: DC
  Administered 2021-08-17 – 2021-08-19 (×5): 100 mg via ORAL
  Filled 2021-08-17 (×5): qty 2

## 2021-08-17 MED ORDER — ALLOPURINOL 100 MG PO TABS
300.0000 mg | ORAL_TABLET | Freq: Every day | ORAL | Status: DC
  Administered 2021-08-17 – 2021-08-19 (×3): 300 mg via ORAL
  Filled 2021-08-17: qty 1
  Filled 2021-08-17: qty 3
  Filled 2021-08-17 (×2): qty 1

## 2021-08-17 MED ORDER — PANTOPRAZOLE SODIUM 40 MG PO TBEC: 40.0000 mg | DELAYED_RELEASE_TABLET | Freq: Two times a day (BID) | ORAL | 0 refills | Status: DC

## 2021-08-17 MED ORDER — DOXAZOSIN MESYLATE 4 MG PO TABS
8.0000 mg | ORAL_TABLET | Freq: Every day | ORAL | Status: DC
  Administered 2021-08-17 – 2021-08-18 (×2): 8 mg via ORAL
  Filled 2021-08-17 (×2): qty 2
  Filled 2021-08-17 (×2): qty 1
  Filled 2021-08-17: qty 2

## 2021-08-17 MED ORDER — HYDROCHLOROTHIAZIDE 25 MG PO TABS
25.0000 mg | ORAL_TABLET | Freq: Every day | ORAL | Status: DC
  Administered 2021-08-17 – 2021-08-19 (×3): 25 mg via ORAL
  Filled 2021-08-17 (×3): qty 1

## 2021-08-17 MED ORDER — IRBESARTAN 150 MG PO TABS
300.0000 mg | ORAL_TABLET | Freq: Every day | ORAL | Status: DC
  Administered 2021-08-17 – 2021-08-19 (×3): 300 mg via ORAL
  Filled 2021-08-17 (×3): qty 2

## 2021-08-17 MED ORDER — CARVEDILOL 6.25 MG PO TABS
12.5000 mg | ORAL_TABLET | Freq: Two times a day (BID) | ORAL | Status: DC
  Administered 2021-08-17 – 2021-08-19 (×5): 12.5 mg via ORAL
  Filled 2021-08-17 (×2): qty 2
  Filled 2021-08-17 (×3): qty 1

## 2021-08-17 MED ORDER — FERROUS SULFATE 325 (65 FE) MG PO TBEC: 325.0000 mg | DELAYED_RELEASE_TABLET | ORAL | 3 refills | Status: AC

## 2021-08-17 NOTE — Progress Notes (Deleted)
Order to discharge pt home.  Discharge instructions/AVS given to patient and reviewed - education provided as needed.  Pt advised to call PCP and/or come back to the hospital if there are any problems. Pt verbalized understanding.    

## 2021-08-17 NOTE — Plan of Care (Signed)

## 2021-08-17 NOTE — Progress Notes (Addendum)
Vonda Antigua, MD [address] [place], [place], [place], [address] [address], [place], [place], [place], [address] Phone: [phone]  Fax: [phone]   Subjective: Pt was planned on being discharged today. However, discharge was cancelled due to 1 large BM noted with red blood and dark stool. No emesis.    Objective: Exam: Vital signs in last 24 hours: Vitals:   08/16/21 2342 08/17/21 0458 08/17/21 0753 08/17/21 1319  BP: (!) 144/67 (!) 167/71 (!) 150/71 (!) 116/47  Pulse: (!) 56 (!) 51 (!) 56 62  Resp: 15 18 18 18   Temp: 98.7 F (37.1 C) 97.7 F (36.5 C) 97.9 F (36.6 C) 98 F (36.7 C)  TempSrc:      SpO2: 97% 99% 97% 96%  Weight:      Height:       Weight change:   Intake/Output Summary (Last 24 hours) at 08/17/2021 1552 Last data filed at 08/17/2021 1355 Gross per 24 hour  Intake 480 ml  Output 175 ml  Net 305 ml    Constitutional: General:   Alert,  Well-developed, well-nourished, pleasant and cooperative in NAD BP (!) 116/47 (BP Location: Right Arm)   Pulse 62   Temp 98 F (36.7 C)   Resp 18   Ht 5\' 6"  (1.676 m)   Wt 123.4 kg   SpO2 96%   BMI 43.90 kg/m   Eyes:  Sclera clear, no icterus.   Conjunctiva pink.   Ears:  No scars, lesions or masses, Normal auditory acuity. Nose:  No deformity, discharge, or lesions. Mouth:  No deformity or lesions, oropharynx pink & moist.  Neck:  Supple; no masses, trachea midline  Respiratory: Normal respiratory effort  Gastrointestinal:  Soft, non-tender and non-distended without masses, hepatosplenomegaly or hernias noted.  No guarding or rebound tenderness.     Cardiac: No clubbing or edema.  No cyanosis. Normal posterior tibial pedal pulses noted.  Lymphatic:  No significant cervical adenopathy.  Psych:  Alert and cooperative. Normal mood and affect.  Musculoskeletal:  Head normocephalic, atraumatic. 5/5 Lower extremity strength bilaterally.  Skin: Warm. Intact without significant  lesions or rashes. No jaundice.  Neurologic:  Face symmetrical, tongue midline, Normal sensation to touch  Psych:  Alert and oriented x3, Alert and cooperative. Normal mood and affect.   Lab Results: Lab Results  Component Value Date   WBC 5.3 08/17/2021   HGB 7.7 (L) 08/17/2021   HCT 23.1 (L) 08/17/2021   MCV 89.4 08/17/2021   PLT 196 08/17/2021   Micro Results: Recent Results (from the past 240 hour(s))  Resp Panel by RT-PCR (Flu A&B, Covid) Nasopharyngeal Swab     Status: None   Collection Time: 08/13/21  9:09 PM   Specimen: Nasopharyngeal Swab; Nasopharyngeal(NP) swabs in vial transport medium  Result Value Ref Range Status   SARS Coronavirus 2 by RT PCR NEGATIVE NEGATIVE Final    Comment: (NOTE) SARS-CoV-2 target nucleic acids are NOT DETECTED.  The SARS-CoV-2 RNA is generally detectable in upper respiratory specimens during the acute phase of infection. The lowest concentration of SARS-CoV-2 viral copies this assay can detect is 138 copies/mL. A negative result does not preclude SARS-Cov-2 infection and should not be used as the sole basis for treatment or other patient management decisions. A negative result may occur with  improper specimen collection/handling, submission of specimen other than nasopharyngeal swab, presence of viral mutation(s) within the areas targeted by this assay, and inadequate number of viral copies(<138 copies/mL). A negative result must  be combined with clinical observations, patient history, and epidemiological information. The expected result is Negative.  Fact Sheet for Patients:  EntrepreneurPulse.com.au  Fact Sheet for Healthcare Providers:  IncredibleEmployment.be  This test is no t yet approved or cleared by the [place] FDA and  has been authorized for detection and/or diagnosis of SARS-CoV-2 by FDA under an Emergency Use Authorization (EUA). This EUA will remain  in effect (meaning this  test can be used) for the duration of the COVID-19 declaration under Section 564(b)(1) of the Act, 21 U.S.C.section 360bbb-3(b)(1), unless the authorization is terminated  or revoked sooner.       Influenza A by PCR NEGATIVE NEGATIVE Final   Influenza B by PCR NEGATIVE NEGATIVE Final    Comment: (NOTE) The Xpert Xpress SARS-CoV-2/FLU/RSV plus assay is intended as an aid in the diagnosis of influenza from Nasopharyngeal swab specimens and should not be used as a sole basis for treatment. Nasal washings and aspirates are unacceptable for Xpert Xpress SARS-CoV-2/FLU/RSV testing.  Fact Sheet for Patients: EntrepreneurPulse.com.au  Fact Sheet for Healthcare Providers: IncredibleEmployment.be  This test is not yet approved or cleared by the [place] FDA and has been authorized for detection and/or diagnosis of SARS-CoV-2 by FDA under an Emergency Use Authorization (EUA). This EUA will remain in effect (meaning this test can be used) for the duration of the COVID-19 declaration under Section 564(b)(1) of the Act, 21 U.S.C. section 360bbb-3(b)(1), unless the authorization is terminated or revoked.  Performed at [facility], [address], [place], [place] [address]    Studies/Results: No results found. Medications:  Scheduled Meds:  sodium chloride   Intravenous Once   allopurinol  300 mg Oral Daily   carvedilol  12.5 mg Oral BID   doxazosin  8 mg Oral QHS   hydrALAZINE  100 mg Oral BID   hydrochlorothiazide  25 mg Oral Daily   irbesartan  300 mg Oral Daily   pantoprazole (PROTONIX) IV  40 mg Intravenous Q12H   Continuous Infusions: PRN Meds:.acetaminophen **OR** acetaminophen, diphenhydrAMINE **AND** acetaminophen, ondansetron **OR** ondansetron (ZOFRAN) IV, traMADol   Assessment: Principal Problem:   Melena Active Problems:   Essential hypertension   Gout   Sleep apnea   Morbid obesity (HCC)   Long-term current  use of opiate analgesic   Chronic pain   History of Anastomotic ulcer S/P gastric bypass   Acute blood loss anemia    Plan: I spoke with Dr. Mal Misty, and patient will need to resume his Plavix due to his history of stroke. Therefore, it would be important to ensure that he does not have any other sources of bleeding, given that the ulcer seen on upper endoscopy was nonbleeding/clean-based and would be unlikely to explain this recurrent bleeding  Patient has not had a colonoscopy in 2 years, therefore colonoscopy to rule out any other sources of bleeding (such as diverticular bleeding) since he had another bowel movement at the time of discharge that was reported to contain bright red blood (suggesting possible lower GI bleed), would be reasonable.  This was discussed with the patient and he is agreeable with the plan.  Patient's hemoglobin dropped 0.3 g since yesterday after the episode of bloody bowel movement today  Since patient remained hemodynamically stable, and hemoglobin did not drop significantly, the dark stool described to have been seen with the red blood could also have been old blood   Patient had solid foods for breakfast and lunch.  Therefore, prep would be best started  tomorrow while he is on a clear liquid diet.  Prep orders placed, with plan for colonoscopy on Monday  I have discussed alternative options, risks & benefits,  which include, but are not limited to, bleeding, infection, perforation,respiratory complication & drug reaction.  The patient agrees with this plan & written consent will be obtained.    PPI IV twice daily  Continue serial CBCs and transfuse PRN Avoid NSAIDs Maintain 2 large-bore IV lines Please page GI with any acute hemodynamic changes, or signs of active GI bleeding    LOS: 4 days   Vonda Antigua, MD 08/17/2021, 3:52 PM

## 2021-08-17 NOTE — Progress Notes (Signed)
Pt had large BM.  Bright red blood and dark stool confirmed by RN.  MD notified.  Discharge order on hold.  Vitals will be obtained in 1 hour per MD order.

## 2021-08-17 NOTE — Progress Notes (Signed)
Progress Note    Randall [name]  DVV:[id number] DOB: 1946/02/05  DOA: 08/13/2021 PCP: Adin Hector, MD      Brief Narrative:    Medical records reviewed and are as summarized below:  Randall [name] is a 75 y.o. male with medical history significant for chronic pain on opiates, hypertension, history of stroke on Plavix, history of DVT left lower extremity, gout, OSA on CPAP, morbid obesity, s/p gastric bypass, bleeding anastomotic ulcer in October 2020 (repeat EGD November 2021 showed healing of anastomotic ulcer), who presented to the hospital because of melena, fatigue and generalized weakness.      Assessment/Plan:   Principal Problem:   Melena Active Problems:   Essential hypertension   Gout   Sleep apnea   Morbid obesity (HCC)   Long-term current use of opiate analgesic   Chronic pain   History of Anastomotic ulcer S/P gastric bypass   Acute blood loss anemia   Body mass index is 43.9 kg/m.  (Morbid obesity)  Melena/acute GI bleeding, history of bleeding anastomotic ulcer: S/p EGD on 08/15/2021 which showed gastric bypass and gastrojejunal anastomosis characterized by ulceration.  He had another large dark stools today.  Dr. Bonna Gains, gastroenterologist, was notified and recommended that patient be kept NPO for possible colonoscopy.  Acute blood loss anemia, iron deficiency anemia: S/p transfusion with 2 units of packed red blood cells.  S/p treatment with IV iron sucrose on 08/15/2021 and 08/16/2021. Hemoglobin was 8 earlier this morning but it dropped to 7.7.  Continue to monitor H&H.   Hypertension: Antihypertensives were started earlier this morning because his blood pressure was high.  Antihypertensives may be discontinued again if he continues to have rectal bleeding.  Hypokalemia: Improved  History of stroke: Hold Plavix  OSA on CPAP at night  Chronic pain/spinal stenosis: Analgesics as needed for pain   Diet Order             Diet NPO  time specified Except for: Sips with Meds  Diet effective now           Diet - low sodium heart healthy                      Consultants: Gastroenterologist  Procedures: EGD on 08/15/2021    Medications:    sodium chloride   Intravenous Once   allopurinol  300 mg Oral Daily   carvedilol  12.5 mg Oral BID   doxazosin  8 mg Oral QHS   hydrALAZINE  100 mg Oral BID   hydrochlorothiazide  25 mg Oral Daily   irbesartan  300 mg Oral Daily   pantoprazole (PROTONIX) IV  40 mg Intravenous Q12H   Continuous Infusions:     Anti-infectives (From admission, onward)    None              Family Communication/Anticipated D/C date and plan/Code Status   DVT prophylaxis: SCDs Start: 08/13/21 2142     Code Status: Full Code  Family Communication: Plan discussed with his wife at the bedside Disposition Plan: Possible discharge to home tomorrow    Status is: Inpatient  Remains inpatient appropriate because: Monitor for rebleeding and monitor H&H        Subjective:   Patient was feeling better this morning and plan was to discharge home today.  However, after discharge order was placed, he passed large dark bloody stools.   Objective:    Vitals:   08/16/21 2342  08/17/21 0458 08/17/21 0753 08/17/21 1319  BP: (!) 144/67 (!) 167/71 (!) 150/71 (!) 116/47  Pulse: (!) 56 (!) 51 (!) 56 62  Resp: 15 18 18 18   Temp: 98.7 F (37.1 C) 97.7 F (36.5 C) 97.9 F (36.6 C) 98 F (36.7 C)  TempSrc:      SpO2: 97% 99% 97% 96%  Weight:      Height:       No data found.   Intake/Output Summary (Last 24 hours) at 08/17/2021 1517 Last data filed at 08/17/2021 1355 Gross per 24 hour  Intake 480 ml  Output 175 ml  Net 305 ml   Filed Weights   08/13/21 1930  Weight: 123.4 kg    Exam:   GEN: NAD SKIN: Warm and dry EYES: No pallor or icterus ENT: MMM CV: RRR PULM: CTA B ABD: soft, obese, NT, +BS CNS: AAO x 3, non focal EXT: No edema or  tenderness        Data Reviewed:   I have personally reviewed following labs and imaging studies:  Labs: Labs show the following:   Basic Metabolic Panel: Recent Labs  Lab 08/13/21 1946 08/16/21 0515 08/17/21 0829  NA 141 140 140  K 4.2 3.1* 3.7  CL 109 109 109  CO2 27 27 23   GLUCOSE 138* 89 97  BUN 36* 11 11  CREATININE 0.90 0.74 0.86  CALCIUM 8.0* 7.8* 8.3*   GFR Estimated Creatinine Clearance: 93.4 mL/min (by C-G formula based on SCr of 0.86 mg/dL). Liver Function Tests: Recent Labs  Lab 08/13/21 1946  AST 20  ALT 14  ALKPHOS 87  BILITOT 0.6  PROT 5.7*  ALBUMIN 3.2*   Recent Labs  Lab 08/13/21 1946  LIPASE 23   No results for input(s): AMMONIA in the last 168 hours. Coagulation profile Recent Labs  Lab 08/13/21 1946  INR 1.2    CBC: Recent Labs  Lab 08/13/21 1946 08/14/21 0114 08/14/21 1529 08/15/21 0554 08/15/21 1204 08/16/21 0515 08/17/21 0829 08/17/21 1416  WBC 7.9  --  5.9 4.8  --  5.4 5.3  --   NEUTROABS  --   --  3.6  --   --  3.1 3.4  --   HGB 8.1*   < > 7.0* 6.5* 8.3* 7.6* 8.0* 7.7*  HCT 23.9*   < > 20.3* 19.3* 24.9* 22.6* 23.5* 23.1*  MCV 88.5  --  87.9 88.1  --  85.9 89.4  --   PLT 244  --  169 162  --  184 196  --    < > = values in this interval not displayed.   Cardiac Enzymes: No results for input(s): CKTOTAL, CKMB, CKMBINDEX, TROPONINI in the last 168 hours. BNP (last 3 results) No results for input(s): PROBNP in the last 8760 hours. CBG: No results for input(s): GLUCAP in the last 168 hours. D-Dimer: No results for input(s): DDIMER in the last 72 hours. Hgb A1c: No results for input(s): HGBA1C in the last 72 hours. Lipid Profile: No results for input(s): CHOL, HDL, LDLCALC, TRIG, CHOLHDL, LDLDIRECT in the last 72 hours. Thyroid function studies: No results for input(s): TSH, T4TOTAL, T3FREE, THYROIDAB in the last 72 hours.  Invalid input(s): FREET3 Anemia work up: No results for input(s): VITAMINB12, FOLATE,  FERRITIN, TIBC, IRON, RETICCTPCT in the last 72 hours.  Sepsis Labs: Recent Labs  Lab 08/14/21 1529 08/15/21 0554 08/16/21 0515 08/17/21 0829  WBC 5.9 4.8 5.4 5.3    Microbiology Recent Results (from  the past 240 hour(s))  Resp Panel by RT-PCR (Flu A&B, Covid) Nasopharyngeal Swab     Status: None   Collection Time: 08/13/21  9:09 PM   Specimen: Nasopharyngeal Swab; Nasopharyngeal(NP) swabs in vial transport medium  Result Value Ref Range Status   SARS Coronavirus 2 by RT PCR NEGATIVE NEGATIVE Final    Comment: (NOTE) SARS-CoV-2 target nucleic acids are NOT DETECTED.  The SARS-CoV-2 RNA is generally detectable in upper respiratory specimens during the acute phase of infection. The lowest concentration of SARS-CoV-2 viral copies this assay can detect is 138 copies/mL. A negative result does not preclude SARS-Cov-2 infection and should not be used as the sole basis for treatment or other patient management decisions. A negative result may occur with  improper specimen collection/handling, submission of specimen other than nasopharyngeal swab, presence of viral mutation(s) within the areas targeted by this assay, and inadequate number of viral copies(<138 copies/mL). A negative result must be combined with clinical observations, patient history, and epidemiological information. The expected result is Negative.  Fact Sheet for Patients:  EntrepreneurPulse.com.au  Fact Sheet for Healthcare Providers:  IncredibleEmployment.be  This test is no t yet approved or cleared by the [place] FDA and  has been authorized for detection and/or diagnosis of SARS-CoV-2 by FDA under an Emergency Use Authorization (EUA). This EUA will remain  in effect (meaning this test can be used) for the duration of the COVID-19 declaration under Section 564(b)(1) of the Act, 21 U.S.C.section 360bbb-3(b)(1), unless the authorization is terminated  or revoked  sooner.       Influenza A by PCR NEGATIVE NEGATIVE Final   Influenza B by PCR NEGATIVE NEGATIVE Final    Comment: (NOTE) The Xpert Xpress SARS-CoV-2/FLU/RSV plus assay is intended as an aid in the diagnosis of influenza from Nasopharyngeal swab specimens and should not be used as a sole basis for treatment. Nasal washings and aspirates are unacceptable for Xpert Xpress SARS-CoV-2/FLU/RSV testing.  Fact Sheet for Patients: EntrepreneurPulse.com.au  Fact Sheet for Healthcare Providers: IncredibleEmployment.be  This test is not yet approved or cleared by the [place] FDA and has been authorized for detection and/or diagnosis of SARS-CoV-2 by FDA under an Emergency Use Authorization (EUA). This EUA will remain in effect (meaning this test can be used) for the duration of the COVID-19 declaration under Section 564(b)(1) of the Act, 21 U.S.C. section 360bbb-3(b)(1), unless the authorization is terminated or revoked.  Performed at [facility], [address] [place], [place] [address]     Procedures and diagnostic studies:  No results found.             LOS: 4 days   Deaire Mcwhirter  [facility]   Pager on [email or web address]. If 7PM-7AM, please contact night-coverage at [email or web address]     08/17/2021, 3:17 PM

## 2021-08-18 DIAGNOSIS — D62 Acute posthemorrhagic anemia: Secondary | ICD-10-CM | POA: Diagnosis not present

## 2021-08-18 DIAGNOSIS — K921 Melena: Secondary | ICD-10-CM | POA: Diagnosis not present

## 2021-08-18 DIAGNOSIS — K289 Gastrojejunal ulcer, unspecified as acute or chronic, without hemorrhage or perforation: Secondary | ICD-10-CM | POA: Diagnosis not present

## 2021-08-18 DIAGNOSIS — T85898A Other specified complication of other internal prosthetic devices, implants and grafts, initial encounter: Secondary | ICD-10-CM | POA: Diagnosis not present

## 2021-08-18 LAB — CBC WITH DIFFERENTIAL/PLATELET
Abs Immature Granulocytes: 0.01 10*3/uL (ref 0.00–0.07)
Basophils Absolute: 0.1 10*3/uL (ref 0.0–0.1)
Basophils Relative: 1 %
Eosinophils Absolute: 0.3 10*3/uL (ref 0.0–0.5)
Eosinophils Relative: 6 %
HCT: 24.1 % — ABNORMAL LOW (ref 39.0–52.0)
Hemoglobin: 8.1 g/dL — ABNORMAL LOW (ref 13.0–17.0)
Immature Granulocytes: 0 %
Lymphocytes Relative: 25 %
Lymphs Abs: 1.2 10*3/uL (ref 0.7–4.0)
MCH: 29.5 pg (ref 26.0–34.0)
MCHC: 33.6 g/dL (ref 30.0–36.0)
MCV: 87.6 fL (ref 80.0–100.0)
Monocytes Absolute: 0.4 10*3/uL (ref 0.1–1.0)
Monocytes Relative: 8 %
Neutro Abs: 2.8 10*3/uL (ref 1.7–7.7)
Neutrophils Relative %: 60 %
Platelets: 213 10*3/uL (ref 150–400)
RBC: 2.75 MIL/uL — ABNORMAL LOW (ref 4.22–5.81)
RDW: 15 % (ref 11.5–15.5)
WBC: 4.8 10*3/uL (ref 4.0–10.5)
nRBC: 0 % (ref 0.0–0.2)

## 2021-08-18 LAB — MAGNESIUM: Magnesium: 1.9 mg/dL (ref 1.7–2.4)

## 2021-08-18 LAB — BASIC METABOLIC PANEL
Anion gap: 6 (ref 5–15)
BUN: 10 mg/dL (ref 8–23)
CO2: 27 mmol/L (ref 22–32)
Calcium: 8.2 mg/dL — ABNORMAL LOW (ref 8.9–10.3)
Chloride: 107 mmol/L (ref 98–111)
Creatinine, Ser: 0.88 mg/dL (ref 0.61–1.24)
GFR, Estimated: >60 mL/min (ref >60)
Glucose, Bld: 95 mg/dL (ref 70–99)
Potassium: 3.4 mmol/L — ABNORMAL LOW (ref 3.5–5.1)
Sodium: 140 mmol/L (ref 135–145)

## 2021-08-18 MED ORDER — PEG 3350-KCL-NA BICARB-NACL 420 G PO SOLR
4000.0000 mL | Freq: Once | ORAL | Status: AC
  Administered 2021-08-18: 4000 mL via ORAL
  Filled 2021-08-18: qty 4000

## 2021-08-18 MED ORDER — SODIUM CHLORIDE 0.9 % IV SOLN
200.0000 mg | Freq: Once | INTRAVENOUS | Status: AC
  Administered 2021-08-18: 200 mg via INTRAVENOUS
  Filled 2021-08-18: qty 200

## 2021-08-18 MED ORDER — POTASSIUM CHLORIDE CRYS ER 20 MEQ PO TBCR
40.0000 meq | EXTENDED_RELEASE_TABLET | Freq: Once | ORAL | Status: AC
  Administered 2021-08-18: 17:00:00 40 meq via ORAL
  Filled 2021-08-18: qty 2

## 2021-08-18 MED ORDER — BISACODYL 5 MG PO TBEC
10.0000 mg | DELAYED_RELEASE_TABLET | Freq: Once | ORAL | Status: AC
  Administered 2021-08-18: 10 mg via ORAL
  Filled 2021-08-18: qty 2

## 2021-08-18 MED ORDER — POTASSIUM CHLORIDE CRYS ER 20 MEQ PO TBCR
40.0000 meq | EXTENDED_RELEASE_TABLET | Freq: Once | ORAL | Status: AC
  Administered 2021-08-18: 40 meq via ORAL
  Filled 2021-08-18: qty 4

## 2021-08-18 MED ORDER — FUROSEMIDE 40 MG PO TABS: 40.0000 mg | ORAL_TABLET | Freq: Once | ORAL | Status: DC

## 2021-08-18 NOTE — Progress Notes (Signed)
Tele order d/c per MD order

## 2021-08-18 NOTE — Progress Notes (Addendum)
Progress Note    Randall [name]  [id number]:[id number] DOB: April 24, 1946  DOA: 08/13/2021 PCP: Adin Hector, MD      Brief Narrative:    Medical records reviewed and are as summarized below:  Randall [name] is a 75 y.o. male with medical history significant for chronic pain on opiates, hypertension, history of stroke on Plavix, history of DVT left lower extremity, gout, OSA on CPAP, morbid obesity, s/p gastric bypass, bleeding anastomotic ulcer in October 2020 (repeat EGD November 2021 showed healing of anastomotic ulcer), who presented to the hospital because of melena, fatigue and generalized weakness.      Assessment/Plan:   Principal Problem:   Melena Active Problems:   Essential hypertension   Gout   Sleep apnea   Morbid obesity (HCC)   Long-term current use of opiate analgesic   Chronic pain   History of Anastomotic ulcer S/P gastric bypass   Acute blood loss anemia   Body mass index is 43.9 kg/m.  (Morbid obesity)  Melena/acute GI bleeding, history of bleeding anastomotic ulcer: S/p EGD on 08/15/2021 which showed gastric bypass and gastrojejunal anastomosis characterized by ulceration.  Plan for colonoscopy tomorrow.  Acute blood loss anemia, iron deficiency anemia: S/p transfusion with 2 units of packed red blood cells.  S/p treatment with IV iron sucrose on 08/15/2021 and 08/16/2021.  Give a third dose of IV iron sucrose today. H&H is stable.  Continue to monitor H&H.   Hypertension: Continue antihypertensives  Hypokalemia: Replete potassium  History of stroke: Plavix is still on hold.  OSA on CPAP at night  Chronic pain/spinal stenosis: Analgesics as needed for pain  5 beat run of nonsustained V. tach noted on telemetry on 08/18/2021: Replete potassium.  2D echo in April 2022 showed normal EF.   Diet Order             Diet NPO time specified Except for: Sips with Meds  Diet effective 0500           Diet clear liquid Room service appropriate?  Yes; Fluid consistency: Thin  Diet effective now           Diet - low sodium heart healthy                      Consultants: Gastroenterologist  Procedures: EGD on 08/15/2021    Medications:    sodium chloride   Intravenous Once   allopurinol  300 mg Oral Daily   carvedilol  12.5 mg Oral BID   doxazosin  8 mg Oral QHS   hydrALAZINE  100 mg Oral BID   hydrochlorothiazide  25 mg Oral Daily   irbesartan  300 mg Oral Daily   pantoprazole (PROTONIX) IV  40 mg Intravenous Q12H   Continuous Infusions:     Anti-infectives (From admission, onward)    None              Family Communication/Anticipated D/C date and plan/Code Status   DVT prophylaxis: SCDs Start: 08/13/21 2142     Code Status: Full Code  Family Communication: Plan discussed with his wife at the bedside Disposition Plan: Possible discharge to home tomorrow    Status is: Inpatient  Remains inpatient appropriate because: Monitor for rebleeding and monitor H&H        Subjective:   No vomiting or bloody stools.  He has not moved his bowels since his last purple-colored stools yesterday morning.     Objective:  Vitals:   08/18/21 0811 08/18/21 1206 08/18/21 1500 08/18/21 1607  BP: (!) 126/49 125/83 (!) 143/69 136/62  Pulse: 64 67 (!) 57 (!) 57  Resp: (!) 22 20 18    Temp: 97.8 F (36.6 C) 98.7 F (37.1 C) 97.9 F (36.6 C) 97.7 F (36.5 C)  TempSrc:   Oral   SpO2: 97% 100% 96% 97%  Weight:      Height:       No data found.   Intake/Output Summary (Last 24 hours) at 08/18/2021 1622 Last data filed at 08/18/2021 1015 Gross per 24 hour  Intake 240 ml  Output 150 ml  Net 90 ml   Filed Weights   08/13/21 1930  Weight: 123.4 kg    Exam:   GEN: NAD SKIN: Warm and dry EYES: No pallor or icterus ENT: MMM CV: RRR PULM: CTA B ABD: soft, obese, NT, +BS CNS: AAO x 3, non focal EXT: Mild bilateral leg edema.  No erythema or tenderness         Data  Reviewed:   I have personally reviewed following labs and imaging studies:  Labs: Labs show the following:   Basic Metabolic Panel: Recent Labs  Lab 08/13/21 1946 08/16/21 0515 08/17/21 0829 08/18/21 0605  NA 141 140 140 140  K 4.2 3.1* 3.7 3.4*  CL 109 109 109 107  CO2 27 27 23 27   GLUCOSE 138* 89 97 95  BUN 36* 11 11 10   CREATININE 0.90 0.74 0.86 0.88  CALCIUM 8.0* 7.8* 8.3* 8.2*   GFR Estimated Creatinine Clearance: 91.3 mL/min (by C-G formula based on SCr of 0.88 mg/dL). Liver Function Tests: Recent Labs  Lab 08/13/21 1946  AST 20  ALT 14  ALKPHOS 87  BILITOT 0.6  PROT 5.7*  ALBUMIN 3.2*   Recent Labs  Lab 08/13/21 1946  LIPASE 23   No results for input(s): AMMONIA in the last 168 hours. Coagulation profile Recent Labs  Lab 08/13/21 1946  INR 1.2    CBC: Recent Labs  Lab 08/14/21 1529 08/15/21 0554 08/15/21 1204 08/16/21 0515 08/17/21 0829 08/17/21 1416 08/18/21 0605  WBC 5.9 4.8  --  5.4 5.3  --  4.8  NEUTROABS 3.6  --   --  3.1 3.4  --  2.8  HGB 7.0* 6.5* 8.3* 7.6* 8.0* 7.7* 8.1*  HCT 20.3* 19.3* 24.9* 22.6* 23.5* 23.1* 24.1*  MCV 87.9 88.1  --  85.9 89.4  --  87.6  PLT 169 162  --  184 196  --  213   Cardiac Enzymes: No results for input(s): CKTOTAL, CKMB, CKMBINDEX, TROPONINI in the last 168 hours. BNP (last 3 results) No results for input(s): PROBNP in the last 8760 hours. CBG: No results for input(s): GLUCAP in the last 168 hours. D-Dimer: No results for input(s): DDIMER in the last 72 hours. Hgb A1c: No results for input(s): HGBA1C in the last 72 hours. Lipid Profile: No results for input(s): CHOL, HDL, LDLCALC, TRIG, CHOLHDL, LDLDIRECT in the last 72 hours. Thyroid function studies: No results for input(s): TSH, T4TOTAL, T3FREE, THYROIDAB in the last 72 hours.  Invalid input(s): FREET3 Anemia work up: No results for input(s): VITAMINB12, FOLATE, FERRITIN, TIBC, IRON, RETICCTPCT in the last 72 hours.  Sepsis Labs: Recent  Labs  Lab 08/15/21 0554 08/16/21 0515 08/17/21 0829 08/18/21 0605  WBC 4.8 5.4 5.3 4.8    Microbiology Recent Results (from the past 240 hour(s))  Resp Panel by RT-PCR (Flu A&B, Covid) Nasopharyngeal Swab  Status: None   Collection Time: 08/13/21  9:09 PM   Specimen: Nasopharyngeal Swab; Nasopharyngeal(NP) swabs in vial transport medium  Result Value Ref Range Status   SARS Coronavirus 2 by RT PCR NEGATIVE NEGATIVE Final    Comment: (NOTE) SARS-CoV-2 target nucleic acids are NOT DETECTED.  The SARS-CoV-2 RNA is generally detectable in upper respiratory specimens during the acute phase of infection. The lowest concentration of SARS-CoV-2 viral copies this assay can detect is 138 copies/mL. A negative result does not preclude SARS-Cov-2 infection and should not be used as the sole basis for treatment or other patient management decisions. A negative result may occur with  improper specimen collection/handling, submission of specimen other than nasopharyngeal swab, presence of viral mutation(s) within the areas targeted by this assay, and inadequate number of viral copies(<138 copies/mL). A negative result must be combined with clinical observations, patient history, and epidemiological information. The expected result is Negative.  Fact Sheet for Patients:  EntrepreneurPulse.com.au  Fact Sheet for Healthcare Providers:  IncredibleEmployment.be  This test is no t yet approved or cleared by the [place] FDA and  has been authorized for detection and/or diagnosis of SARS-CoV-2 by FDA under an Emergency Use Authorization (EUA). This EUA will remain  in effect (meaning this test can be used) for the duration of the COVID-19 declaration under Section 564(b)(1) of the Act, 21 U.S.C.section 360bbb-3(b)(1), unless the authorization is terminated  or revoked sooner.       Influenza A by PCR NEGATIVE NEGATIVE Final   Influenza B by PCR  NEGATIVE NEGATIVE Final    Comment: (NOTE) The Xpert Xpress SARS-CoV-2/FLU/RSV plus assay is intended as an aid in the diagnosis of influenza from Nasopharyngeal swab specimens and should not be used as a sole basis for treatment. Nasal washings and aspirates are unacceptable for Xpert Xpress SARS-CoV-2/FLU/RSV testing.  Fact Sheet for Patients: EntrepreneurPulse.com.au  Fact Sheet for Healthcare Providers: IncredibleEmployment.be  This test is not yet approved or cleared by the [place] FDA and has been authorized for detection and/or diagnosis of SARS-CoV-2 by FDA under an Emergency Use Authorization (EUA). This EUA will remain in effect (meaning this test can be used) for the duration of the COVID-19 declaration under Section 564(b)(1) of the Act, 21 U.S.C. section 360bbb-3(b)(1), unless the authorization is terminated or revoked.  Performed at [facility], [address] [place], [place] [address]     Procedures and diagnostic studies:  No results found.             LOS: 5 days   Danyle Boening  [facility]   Pager on [email or web address]. If 7PM-7AM, please contact night-coverage at [email or web address]     08/18/2021, 4:22 PM

## 2021-08-18 NOTE — Progress Notes (Signed)
CCMD notified RN of 5 beat run of v-tach.  MD notified.  Will continue to monitor.

## 2021-08-19 ENCOUNTER — Inpatient Hospital Stay: Payer: Medicare Other | Admitting: Anesthesiology

## 2021-08-19 ENCOUNTER — Encounter
Admission: EM | Disposition: A | Discharge status: Home / Self Care | Payer: Self-pay | Source: Home / Self Care | Attending: Internal Medicine

## 2021-08-19 DIAGNOSIS — K921 Melena: Secondary | ICD-10-CM | POA: Diagnosis not present

## 2021-08-19 HISTORY — PX: COLONOSCOPY: SHX5424

## 2021-08-19 LAB — BASIC METABOLIC PANEL
Anion gap: 5 (ref 5–15)
BUN: 8 mg/dL (ref 8–23)
CO2: 27 mmol/L (ref 22–32)
Calcium: 8.4 mg/dL — ABNORMAL LOW (ref 8.9–10.3)
Chloride: 111 mmol/L (ref 98–111)
Creatinine, Ser: 0.85 mg/dL (ref 0.61–1.24)
GFR, Estimated: >60 mL/min (ref >60)
Glucose, Bld: 95 mg/dL (ref 70–99)
Potassium: 3.9 mmol/L (ref 3.5–5.1)
Sodium: 143 mmol/L (ref 135–145)

## 2021-08-19 LAB — CBC WITH DIFFERENTIAL/PLATELET
Abs Immature Granulocytes: 0.02 10*3/uL (ref 0.00–0.07)
Basophils Absolute: 0.1 10*3/uL (ref 0.0–0.1)
Basophils Relative: 1 %
Eosinophils Absolute: 0.3 10*3/uL (ref 0.0–0.5)
Eosinophils Relative: 6 %
HCT: 25.1 % — ABNORMAL LOW (ref 39.0–52.0)
Hemoglobin: 8.5 g/dL — ABNORMAL LOW (ref 13.0–17.0)
Immature Granulocytes: 1 %
Lymphocytes Relative: 25 %
Lymphs Abs: 1.1 10*3/uL (ref 0.7–4.0)
MCH: 31 pg (ref 26.0–34.0)
MCHC: 33.9 g/dL (ref 30.0–36.0)
MCV: 91.6 fL (ref 80.0–100.0)
Monocytes Absolute: 0.4 10*3/uL (ref 0.1–1.0)
Monocytes Relative: 8 %
Neutro Abs: 2.6 10*3/uL (ref 1.7–7.7)
Neutrophils Relative %: 59 %
Platelets: 155 10*3/uL (ref 150–400)
RBC: 2.74 MIL/uL — ABNORMAL LOW (ref 4.22–5.81)
RDW: 15.6 % — ABNORMAL HIGH (ref 11.5–15.5)
WBC: 4.4 10*3/uL (ref 4.0–10.5)
nRBC: 0 % (ref 0.0–0.2)

## 2021-08-19 LAB — MAGNESIUM: Magnesium: 2.1 mg/dL (ref 1.7–2.4)

## 2021-08-19 SURGERY — COLONOSCOPY
Anesthesia: General

## 2021-08-19 MED ORDER — PROPOFOL 500 MG/50ML IV EMUL
INTRAVENOUS | Status: DC | PRN
  Administered 2021-08-19: 100 ug/kg/min via INTRAVENOUS

## 2021-08-19 MED ORDER — LIDOCAINE HCL (CARDIAC) PF 100 MG/5ML IV SOSY
PREFILLED_SYRINGE | INTRAVENOUS | Status: DC | PRN
  Administered 2021-08-19: 50 mg via INTRAVENOUS

## 2021-08-19 MED ORDER — OMEPRAZOLE 40 MG PO CPDR: 40.0000 mg | DELAYED_RELEASE_CAPSULE | Freq: Two times a day (BID) | ORAL | Status: DC

## 2021-08-19 MED ORDER — SODIUM CHLORIDE 0.9 % IV SOLN
INTRAVENOUS | Status: DC
  Administered 2021-08-19: 20 mL/h via INTRAVENOUS

## 2021-08-19 MED ORDER — PROPOFOL 10 MG/ML IV BOLUS
INTRAVENOUS | Status: DC | PRN
  Administered 2021-08-19: 80 mg via INTRAVENOUS

## 2021-08-19 NOTE — Op Note (Signed)
[facility] Gastroenterology Patient Name: Randall [name] Procedure Date: 08/19/2021 9:24 AM MRN: [id number] Account #: [id number] Date of Birth: Aug 24, 1946 Admit Type: Inpatient Age: 75 Room: [facility] ENDO ROOM 4 Gender: Male Note Status: Finalized Instrument Name: Park Meo [id number] Procedure:             Colonoscopy Indications:           Melena Providers:             Jonathon Bellows MD, MD Medicines:             Monitored Anesthesia Care Complications:         No immediate complications. Procedure:             Pre-Anesthesia Assessment:                        - Prior to the procedure, a History and Physical was                         performed, and patient medications, allergies and                         sensitivities were reviewed. The patient's tolerance                         of previous anesthesia was reviewed.                        - The risks and benefits of the procedure and the                         sedation options and risks were discussed with the                         patient. All questions were answered and informed                         consent was obtained.                        - ASA Grade Assessment: II - A patient with mild                         systemic disease.                        After obtaining informed consent, the colonoscope was                         passed under direct vision. Throughout the procedure,                         the patient's blood pressure, pulse, and oxygen                         saturations were monitored continuously. The                         Colonoscope was introduced through the anus and  advanced to the the cecum, identified by the                         appendiceal orifice. The colonoscopy was performed                         with ease. The patient tolerated the procedure well.                         The quality of the bowel preparation was good. Findings:      The  perianal and digital rectal examinations were normal.      The entire examined colon appeared normal on direct and retroflexion       views. Impression:            - The entire examined colon is normal on direct and                         retroflexion views.                        - No specimens collected. Recommendation:        - Return patient to hospital ward for ongoing care.                        - Advance diet as tolerated.                        - Continue present medications. Procedure Code(s):     --- Professional ---                        [phone], Colonoscopy, flexible; diagnostic, including                         collection of specimen(s) by brushing or washing, when                         performed (separate procedure) Diagnosis Code(s):     --- Professional ---                        K92.1, Melena (includes Hematochezia) CPT copyright 2019 American Medical Association. All rights reserved. The codes documented in this report are preliminary and upon coder review may  be revised to meet current compliance requirements. Jonathon Bellows, MD Jonathon Bellows MD, MD 08/19/2021 9:54:28 AM This report has been signed electronically. Number of Addenda: 0 Note Initiated On: 08/19/2021 9:24 AM Scope Withdrawal Time: 0 hours 7 minutes 7 seconds  Total Procedure Duration: 0 hours 11 minutes 22 seconds  Estimated Blood Loss:  Estimated blood loss: none.      [facility]

## 2021-08-19 NOTE — H&P (Signed)
Jonathon Bellows, MD [address], [place], [place], [place], [address] [address], [place], [place], [place], [address] Phone: [phone]  Fax: [phone]  Primary Care Physician:  Adin Hector, MD   Pre-Procedure History & Physical: HPI:  Randall [name] is a 75 y.o. male is here for an colonoscopy.   Past Medical History:  Diagnosis Date   Allergy    Anemia    Arthritis    Cataract    Chronic kidney disease    Kidney stone   Clotting disorder ([place])    Deep vein blood clot of left lower extremity (HCC)    GERD (gastroesophageal reflux disease)    Gout    Hypertension    Melanoma ([place]) 2014   forehead   Sleep apnea    cpap - uses it nightly   Stroke [facility])    TIA    Past Surgical History:  Procedure Laterality Date   BACK SURGERY  2005   spinal stenosis   barett's esophagus  2014   CARPAL TUNNEL RELEASE Left 10/30/2017   COLONOSCOPY  07/29/2019   [place]   ESOPHAGOGASTRODUODENOSCOPY (EGD) WITH PROPOFOL N/A 08/15/2021   Procedure: ESOPHAGOGASTRODUODENOSCOPY (EGD) WITH PROPOFOL;  Surgeon: Lucilla Lame, MD;  Location: [facility] ENDOSCOPY;  Service: Endoscopy;  Laterality: N/A;   Eye lid surgery Bilateral 11/15/2013   GASTRIC BYPASS  2014   POLYPECTOMY     removal melanoma  02/27/2014   forehead   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 Authorizing Provider  allopurinol (ZYLOPRIM) 300 MG tablet Take 300 mg by mouth daily.   Yes [provider]  Cholecalciferol (VITAMIN D3) 5000 UNITS TABS Take 5,000 Units by mouth daily.    Yes [provider]  clopidogrel (PLAVIX) 75 MG tablet Take 75 mg by mouth daily.  04/30/18  Yes [provider]  Cranberry 200 MG CAPS  09/01/19  Yes [provider]  Cyanocobalamin (VITAMIN B-12) 2500 MCG SUBL Place 2,500 mcg under the tongue daily.    Yes [provider]  doxazosin (CARDURA) 8 MG tablet Take 8 mg by mouth at bedtime.  06/26/15  Yes [provider]  flintstones complete (FLINTSTONES) 60 MG chewable tablet Chew 1 tablet by mouth daily.    Yes [provider]  hydrALAZINE (APRESOLINE) 100 MG tablet Take 100 mg by mouth 2 (two) times daily. 03/05/18  Yes [provider]  hydrochlorothiazide (HYDRODIURIL) 25 MG tablet  06/07/19  Yes [provider]  hydrocortisone (ANUSOL-HC) 2.5 % rectal cream APPLY TOPICALLY TWICE DAILY FOR 10 DAYS 09/08/19  Yes [provider]  ketoconazole (NIZORAL) 2 % shampoo  05/06/19  Yes [provider]  omeprazole (PRILOSEC) 40 MG capsule TAKE 1 CAPSULE BY MOUTH TWICE DAILY. OPEN CONTENTS OF CAPSULE ONTO APPLESAUCE 07/25/21  Yes Irene Shipper, MD  OVER THE COUNTER MEDICATION Vicks Sinex Severe Nasal Decongestant, One drop in each nostril, 2 times daily.   Yes [provider]  pantoprazole (PROTONIX) 40 MG tablet Take 1 tablet (40 mg total) by mouth 2 (two) times daily. 08/17/21  Yes Jennye Boroughs, MD  sucralfate (  CARAFATE) 1 GM/10ML suspension Take 60ml by mouth 4 times a day 12/24/20  Yes Irene Shipper, MD  telmisartan (MICARDIS) 80 MG tablet Take 80 mg by mouth daily. 04/30/18  Yes [provider]  traMADol (ULTRAM) 50 MG tablet Take 50 mg by mouth every 6 (six) hours as needed for pain. 02/15/18  Yes [provider]  carvedilol (COREG) 12.5 MG tablet Take 12.5 mg by mouth 2 (two) times daily. 05/14/18   [provider]  diphenhydramine-acetaminophen (TYLENOL PM) 25-500 MG TABS Take 1 tablet by mouth at bedtime as needed (sleep).     [provider]  ferrous sulfate 325 (65 FE) MG EC tablet Take 1 tablet (325 mg total) by  mouth every other day. 08/17/21   Jennye Boroughs, MD  fluocinonide (LIDEX) 0.05 % external solution Apply topically. Patient not taking: No sig reported 06/28/20   [provider]    Allergies as of 08/13/2021 - Review Complete 08/13/2021  Allergen Reaction Noted   Amlodipine Other (See Comments) 06/08/2015   Nsaids Other (See Comments) 06/08/2015   Tolmetin Other (See Comments) 06/08/2015    Family History  Problem Relation Age of Onset   Prostate cancer Father    Endometrial cancer Mother    Colon cancer Neg Hx    Esophageal cancer Neg Hx    Rectal cancer Neg Hx    Stomach cancer Neg Hx     Social History   Socioeconomic History   Marital status: Married    Spouse name: Guerry Minors   Number of children: 1   Years of education: Not on file   Highest education level: Not on file  Occupational History   Occupation: retired  Tobacco Use   Smoking status: Never   Smokeless tobacco: Former    Types: Snuff  Vaping Use   Vaping Use: Never used  Substance and Sexual Activity   Alcohol use: Yes    Alcohol/week: 0.0 standard drinks    Comment: 1 glass wine a month   Drug use: No   Sexual activity: Not Currently  Other Topics Concern   Not on file  Social History Narrative   Not on file   Social Determinants of Health   Financial Resource Strain: Not on file  Food Insecurity: Not on file  Transportation Needs: Not on file  Physical Activity: Not on file  Stress: Not on file  Social Connections: Not on file  Intimate Partner Violence: Not on file    Review of Systems: See HPI, otherwise negative ROS  Physical Exam: BP (!) 141/68   Pulse 66   Temp 98.1 F (36.7 C) (Temporal)   Resp 20   Ht 5\' 6"  (1.676 m)   Wt 123.4 kg   SpO2 98%   BMI 43.90 kg/m  General:   Alert,  pleasant and cooperative in NAD Head:  Normocephalic and atraumatic. Neck:  Supple; no masses or thyromegaly. Lungs:  Clear throughout to auscultation, normal respiratory effort.     Heart:  +S1, +S2, Regular rate and rhythm, No edema. Abdomen:  Soft, nontender and nondistended. Normal bowel sounds, without guarding, and without rebound.   Neurologic:  Alert and  oriented x4;  grossly normal neurologically.  Impression/Plan: Randall [name] is here for an colonoscopy to be performed for gi bleed Risks, benefits, limitations, and alternatives regarding  colonoscopy have been reviewed with the patient.  Questions have been answered.  All parties agreeable.   Jonathon Bellows, MD  08/19/2021, 9:30 AM

## 2021-08-19 NOTE — Discharge Summary (Signed)
Physician Discharge Summary  Randall [name] ERD:[id number] DOB: 25-Dec-1945 DOA: 08/13/2021  PCP: Adin Hector, MD  Admit date: 08/13/2021 Discharge date: 08/19/2021  Discharge disposition: Home   Recommendations for Outpatient Follow-Up:   Follow-up with PCP in 1 week. Follow-up with Dr. Allen Norris, gastroenterologist, in 1 month   Discharge Diagnosis:   Principal Problem:   Melena Active Problems:   Essential hypertension   Gout   Sleep apnea   Morbid obesity ([place])   Long-term current use of opiate analgesic   Chronic pain   History of Anastomotic ulcer S/P gastric bypass   Acute blood loss anemia    Discharge Condition: Stable.  Diet recommendation:  Diet Order             Diet Heart Room service appropriate? Yes; Fluid consistency: Thin  Diet effective now           Diet - low sodium heart healthy                     Code Status: Full Code     Hospital Course:   Randall [name] is a 75 y.o. male with medical history significant for chronic pain on opiates, hypertension, history of stroke on Plavix, history of DVT left lower extremity, gout, OSA on CPAP, morbid obesity, s/p gastric bypass, bleeding anastomotic ulcer in October 2020 (repeat EGD November 2021 showed healing of anastomotic ulcer), who presented to the hospital because of melena, fatigue and generalized weakness.   He was treated with IV fluids, IV iron sucrose and transfused with 2 units of packed red blood cells.  He underwent EGD which showed gastric bypass and gastrojejunal anastomosis characterized by ulceration.  He also had colonoscopy which was unremarkable.  Melena has resolved and posttransfusion H&H remained stable.  He is deemed stable for discharge to home.  Discharge plan was discussed with the patient and his wife at the bedside.    Medical Consultants:   Gastroenterologist   Discharge Exam:    Vitals:   08/19/21 0957 08/19/21 0958 08/19/21 1045 08/19/21 1112   BP: (!) 91/34 (!) 91/34  117/72  Pulse: 62 60  62  Resp: 13 12 18 18   Temp: 97.9 F (36.6 C)   (!) 97.5 F (36.4 C)  TempSrc: Tympanic   Oral  SpO2: 99% 99%  100%  Weight:      Height:         GEN: NAD SKIN: Warm and dry EYES: No pallor or icterus ENT: MMM CV: RRR PULM: CTA B ABD: soft, obese, NT, +BS CNS: AAO x 3, non focal EXT: Trace bilateral leg edema.  No erythema or tenderness   The results of significant diagnostics from this hospitalization (including imaging, microbiology, ancillary and laboratory) are listed below for reference.     Procedures and Diagnostic Studies:   No results found.   Labs:   Basic Metabolic Panel: Recent Labs  Lab 08/13/21 1946 08/16/21 0515 08/17/21 0829 08/18/21 0605 08/19/21 0507  NA 141 140 140 140 143  K 4.2 3.1* 3.7 3.4* 3.9  CL 109 109 109 107 111  CO2 27 27 23 27 27   GLUCOSE 138* 89 97 95 95  BUN 36* 11 11 10 8   CREATININE 0.90 0.74 0.86 0.88 0.85  CALCIUM 8.0* 7.8* 8.3* 8.2* 8.4*  MG  --   --   --  1.9 2.1   GFR Estimated Creatinine Clearance: 94.5 mL/min (by C-G formula based on  SCr of 0.85 mg/dL). Liver Function Tests: Recent Labs  Lab 08/13/21 1946  AST 20  ALT 14  ALKPHOS 87  BILITOT 0.6  PROT 5.7*  ALBUMIN 3.2*   Recent Labs  Lab 08/13/21 1946  LIPASE 23   No results for input(s): AMMONIA in the last 168 hours. Coagulation profile Recent Labs  Lab 08/13/21 1946  INR 1.2    CBC: Recent Labs  Lab 08/14/21 1529 08/15/21 0554 08/15/21 1204 08/16/21 0515 08/17/21 0829 08/17/21 1416 08/18/21 0605 08/19/21 0507  WBC 5.9 4.8  --  5.4 5.3  --  4.8 4.4  NEUTROABS 3.6  --   --  3.1 3.4  --  2.8 2.6  HGB 7.0* 6.5*   < > 7.6* 8.0* 7.7* 8.1* 8.5*  HCT 20.3* 19.3*   < > 22.6* 23.5* 23.1* 24.1* 25.1*  MCV 87.9 88.1  --  85.9 89.4  --  87.6 91.6  PLT 169 162  --  184 196  --  213 155   < > = values in this interval not displayed.   Cardiac Enzymes: No results for input(s): CKTOTAL, CKMB,  CKMBINDEX, TROPONINI in the last 168 hours. BNP: Invalid input(s): POCBNP CBG: No results for input(s): GLUCAP in the last 168 hours. D-Dimer No results for input(s): DDIMER in the last 72 hours. Hgb A1c No results for input(s): HGBA1C in the last 72 hours. Lipid Profile No results for input(s): CHOL, HDL, LDLCALC, TRIG, CHOLHDL, LDLDIRECT in the last 72 hours. Thyroid function studies No results for input(s): TSH, T4TOTAL, T3FREE, THYROIDAB in the last 72 hours.  Invalid input(s): FREET3 Anemia work up No results for input(s): VITAMINB12, FOLATE, FERRITIN, TIBC, IRON, RETICCTPCT in the last 72 hours. Microbiology Recent Results (from the past 240 hour(s))  Resp Panel by RT-PCR (Flu A&B, Covid) Nasopharyngeal Swab     Status: None   Collection Time: 08/13/21  9:09 PM   Specimen: Nasopharyngeal Swab; Nasopharyngeal(NP) swabs in vial transport medium  Result Value Ref Range Status   SARS Coronavirus 2 by RT PCR NEGATIVE NEGATIVE Final    Comment: (NOTE) SARS-CoV-2 target nucleic acids are NOT DETECTED.  The SARS-CoV-2 RNA is generally detectable in upper respiratory specimens during the acute phase of infection. The lowest concentration of SARS-CoV-2 viral copies this assay can detect is 138 copies/mL. A negative result does not preclude SARS-Cov-2 infection and should not be used as the sole basis for treatment or other patient management decisions. A negative result may occur with  improper specimen collection/handling, submission of specimen other than nasopharyngeal swab, presence of viral mutation(s) within the areas targeted by this assay, and inadequate number of viral copies(<138 copies/mL). A negative result must be combined with clinical observations, patient history, and epidemiological information. The expected result is Negative.  Fact Sheet for Patients:  EntrepreneurPulse.com.au  Fact Sheet for Healthcare Providers:   IncredibleEmployment.be  This test is no t yet approved or cleared by the [place] FDA and  has been authorized for detection and/or diagnosis of SARS-CoV-2 by FDA under an Emergency Use Authorization (EUA). This EUA will remain  in effect (meaning this test can be used) for the duration of the COVID-19 declaration under Section 564(b)(1) of the Act, 21 U.S.C.section 360bbb-3(b)(1), unless the authorization is terminated  or revoked sooner.       Influenza A by PCR NEGATIVE NEGATIVE Final   Influenza B by PCR NEGATIVE NEGATIVE Final    Comment: (NOTE) The Xpert Xpress SARS-CoV-2/FLU/RSV plus assay is intended as  an aid in the diagnosis of influenza from Nasopharyngeal swab specimens and should not be used as a sole basis for treatment. Nasal washings and aspirates are unacceptable for Xpert Xpress SARS-CoV-2/FLU/RSV testing.  Fact Sheet for Patients: EntrepreneurPulse.com.au  Fact Sheet for Healthcare Providers: IncredibleEmployment.be  This test is not yet approved or cleared by the [place] FDA and has been authorized for detection and/or diagnosis of SARS-CoV-2 by FDA under an Emergency Use Authorization (EUA). This EUA will remain in effect (meaning this test can be used) for the duration of the COVID-19 declaration under Section 564(b)(1) of the Act, 21 U.S.C. section 360bbb-3(b)(1), unless the authorization is terminated or revoked.  Performed at [facility], [address] [place], [place] [address]      Discharge Instructions:   Discharge Instructions     Diet - low sodium heart healthy   Complete by: As directed    Increase activity slowly   Complete by: As directed       Allergies as of 08/19/2021       Reactions   Amlodipine Other (See Comments)   Worsened edema.   Nsaids Other (See Comments)   Not supposed to take due to gastric bypass surgery   Tolmetin Other (See  Comments)   Not supposed to take due to gastric bypass surgery        Medication List     STOP taking these medications    fluocinonide 0.05 % external solution Commonly known as: LIDEX       TAKE these medications    allopurinol 300 MG tablet Commonly known as: ZYLOPRIM Take 300 mg by mouth daily.   carvedilol 12.5 MG tablet Commonly known as: COREG Take 12.5 mg by mouth 2 (two) times daily.   clopidogrel 75 MG tablet Commonly known as: PLAVIX Take 75 mg by mouth daily.   Cranberry 200 MG Caps   diphenhydramine-acetaminophen 25-500 MG Tabs tablet Commonly known as: TYLENOL PM Take 1 tablet by mouth at bedtime as needed (sleep).   doxazosin 8 MG tablet Commonly known as: CARDURA Take 8 mg by mouth at bedtime.   ferrous sulfate 325 (65 FE) MG EC tablet Take 1 tablet (325 mg total) by mouth every other day. What changed: when to take this   flintstones complete 60 MG chewable tablet Chew 1 tablet by mouth daily.   hydrALAZINE 100 MG tablet Commonly known as: APRESOLINE Take 100 mg by mouth 2 (two) times daily.   hydrochlorothiazide 25 MG tablet Commonly known as: HYDRODIURIL   hydrocortisone 2.5 % rectal cream Commonly known as: ANUSOL-HC APPLY TOPICALLY TWICE DAILY FOR 10 DAYS   ketoconazole 2 % shampoo Commonly known as: NIZORAL   omeprazole 40 MG capsule Commonly known as: PRILOSEC Take 1 capsule (40 mg total) by mouth 2 (two) times daily. What changed: See the new instructions.   OVER THE COUNTER MEDICATION Vicks Sinex Severe Nasal Decongestant, One drop in each nostril, 2 times daily.   sucralfate 1 GM/10ML suspension Commonly known as: CARAFATE Take 62ml by mouth 4 times a day   telmisartan 80 MG tablet Commonly known as: MICARDIS Take 80 mg by mouth daily.   traMADol 50 MG tablet Commonly known as: ULTRAM Take 50 mg by mouth every 6 (six) hours as needed for pain.   Vitamin B-12 2500 MCG Subl Place 2,500 mcg under the tongue  daily.   Vitamin D3 125 MCG (5000 UT) Tabs Take 5,000 Units by mouth daily.  Follow-up Information     Lucilla Lame, MD. Schedule an appointment as soon as possible for a visit in 1 month(s).   Specialty: Gastroenterology Contact information: [place]  [place] [address] [phone]                   If you experience worsening of your admission symptoms, develop shortness of breath, life threatening emergency, suicidal or homicidal thoughts you must seek medical attention immediately by calling 911 or calling your MD immediately  if symptoms less severe.   You must read complete instructions/literature along with all the possible adverse reactions/side effects for all the medicines you take and that have been prescribed to you. Take any new medicines after you have completely understood and accept all the possible adverse reactions/side effects.    Please note   You were cared for by a hospitalist during your hospital stay. If you have any questions about your discharge medications or the care you received while you were in the hospital after you are discharged, you can call the unit and asked to speak with the hospitalist on call if the hospitalist that took care of you is not available. Once you are discharged, your primary care physician will handle any further medical issues. Please note that NO REFILLS for any discharge medications will be authorized once you are discharged, as it is imperative that you return to your primary care physician (or establish a relationship with a primary care physician if you do not have one) for your aftercare needs so that they can reassess your need for medications and monitor your lab values.       Time coordinating discharge: 35 minutes  Signed:  Dellene Mcgroarty  [facility] 08/19/2021, 12:25 PM   Pager on [email or web address]. If 7PM-7AM, please contact night-coverage at [email or web address]

## 2021-08-19 NOTE — Anesthesia Postprocedure Evaluation (Signed)
Anesthesia Post Note  Patient: Randall [name]  Procedure(s) Performed: COLONOSCOPY  Patient location during evaluation: Endoscopy Anesthesia Type: General Level of consciousness: awake and alert and oriented Pain management: pain level controlled Vital Signs Assessment: post-procedure vital signs reviewed and stable Respiratory status: spontaneous breathing, nonlabored ventilation and respiratory function stable Cardiovascular status: blood pressure returned to baseline and stable Postop Assessment: no signs of nausea or vomiting Anesthetic complications: no   No notable events documented.   Last Vitals:  Vitals:   08/19/21 0957 08/19/21 0958  BP: (!) 91/34 (!) 91/34  Pulse: 62 60  Resp: 13 12  Temp: 36.6 C   SpO2: 99% 99%    Last Pain:  Vitals:   08/19/21 0957  TempSrc: Tympanic  PainSc: Asleep                 Nakya Weyand

## 2021-08-19 NOTE — Transfer of Care (Signed)
Immediate Anesthesia Transfer of Care Note  Patient: Randall [name]  Procedure(s) Performed: COLONOSCOPY  Patient Location: PACU and Endoscopy Unit  Anesthesia Type:General  Level of Consciousness: drowsy and patient cooperative  Airway & Oxygen Therapy: Patient Spontanous Breathing  Post-op Assessment: Report given to RN and Post -op Vital signs reviewed and stable  Post vital signs: Reviewed and stable  Last Vitals:  Vitals Value Taken Time  BP 98/45 08/19/21 1000  Temp 36.6 C 08/19/21 0957  Pulse 62 08/19/21 1000  Resp 16 08/19/21 1000  SpO2 97 % 08/19/21 1000  Vitals shown include unvalidated device data.  Last Pain:  Vitals:   08/19/21 0957  TempSrc: Tympanic  PainSc: Asleep         Complications: No notable events documented.

## 2021-08-19 NOTE — Progress Notes (Signed)
Pt refused midnight VS. No concerns at this time. CPAP in place.  Earleen Reaper, RN

## 2021-08-19 NOTE — Progress Notes (Signed)
Pt being discharged home, discharge instructions reviewed with pt and wife, states understanding, pt with no complaints 

## 2021-08-19 NOTE — Anesthesia Preprocedure Evaluation (Addendum)
Anesthesia Evaluation  Patient identified by MRN, date of birth, ID band Patient awake    Reviewed: Allergy & Precautions, NPO status , Patient's Chart, lab work & pertinent test results  History of Anesthesia Complications Negative for: history of anesthetic complications  Airway Mallampati: II  TM Distance: >3 FB Neck ROM: Full    Dental  (+) Poor Dentition   Pulmonary sleep apnea and Continuous Positive Airway Pressure Ventilation , neg COPD,    breath sounds clear to auscultation- rhonchi (-) wheezing      Cardiovascular hypertension, Pt. on medications + Peripheral Vascular Disease  (-) CAD, (-) Past MI, (-) Cardiac Stents and (-) CABG  Rhythm:Regular Rate:Normal - Systolic murmurs and - Diastolic murmurs    Neuro/Psych CVA, No Residual Symptoms negative psych ROS   GI/Hepatic Neg liver ROS, GERD  ,  Endo/Other  negative endocrine ROSneg diabetes  Renal/GU negative Renal ROS     Musculoskeletal  (+) Arthritis ,   Abdominal (+) + obese,   Peds  Hematology  (+) anemia ,   Anesthesia Other Findings Past Medical History: No date: Allergy No date: Anemia No date: Arthritis No date: Cataract No date: Chronic kidney disease     Comment:  Kidney stone No date: Clotting disorder (HCC) No date: Deep vein blood clot of left lower extremity (HCC) No date: GERD (gastroesophageal reflux disease) No date: Gout No date: Hypertension 2014: Melanoma ([place])     Comment:  forehead No date: Sleep apnea     Comment:  cpap - uses it nightly No date: Stroke [facility])     Comment:  TIA   Reproductive/Obstetrics negative OB ROS                            Anesthesia Physical Anesthesia Plan  ASA: 3  Anesthesia Plan: General   Post-op Pain Management:    Induction: Intravenous  PONV Risk Score and Plan: 1 and Propofol infusion  Airway Management Planned: Natural Airway  Additional Equipment:    Intra-op Plan:   Post-operative Plan:   Informed Consent: I have reviewed the patients History and Physical, chart, labs and discussed the procedure including the risks, benefits and alternatives for the proposed anesthesia with the patient or authorized representative who has indicated his/her understanding and acceptance.     Dental advisory given  Plan Discussed with: CRNA and Anesthesiologist  Anesthesia Plan Comments:         Anesthesia Quick Evaluation

## 2021-08-20 ENCOUNTER — Encounter: Payer: Self-pay | Admitting: Gastroenterology

## 2021-09-06 ENCOUNTER — Ambulatory Visit: Payer: Medicare Other | Admitting: Urology

## 2021-09-18 NOTE — Anesthesia Postprocedure Evaluation (Signed)
Anesthesia Post Note  Patient: Randall [name]  Procedure(s) Performed: ESOPHAGOGASTRODUODENOSCOPY (EGD) WITH PROPOFOL  Anesthesia Type: General Level of consciousness: awake Pain management: pain level controlled Vital Signs Assessment: post-procedure vital signs reviewed and stable Respiratory status: spontaneous breathing Cardiovascular status: blood pressure returned to baseline Anesthetic complications: no   No notable events documented.   Last Vitals:  Vitals:   08/19/21 1045 08/19/21 1112  BP:  117/72  Pulse:  62  Resp: 18 18  Temp:  (!) 36.4 C  SpO2:  100%    Last Pain:  Vitals:   08/19/21 1112  TempSrc: Oral  PainSc:                  VAN STAVEREN,Macai Sisneros

## 2021-09-19 ENCOUNTER — Other Ambulatory Visit: Payer: Self-pay

## 2021-09-19 ENCOUNTER — Ambulatory Visit (INDEPENDENT_AMBULATORY_CARE_PROVIDER_SITE_OTHER): Payer: Medicare Other | Admitting: Gastroenterology

## 2021-09-19 ENCOUNTER — Encounter: Payer: Self-pay | Admitting: Gastroenterology

## 2021-09-19 VITALS — BP 175/91 | HR 54 | Temp 98.4°F | Ht 67.0 in | Wt 273.4 lb

## 2021-09-19 DIAGNOSIS — K259 Gastric ulcer, unspecified as acute or chronic, without hemorrhage or perforation: Secondary | ICD-10-CM | POA: Diagnosis not present

## 2021-09-19 NOTE — Progress Notes (Signed)
Primary Care Physician: Adin Hector, MD  Primary Gastroenterologist:  Dr. Lucilla Lame  Chief Complaint  Patient presents with   Hospitalization Follow-up    HPI: Randall [name] is a 75 y.o. male here for follow-up after being in the hospital.  The patient was in the hospital for GI bleed and had been seen in the past by Dr. Henrene Pastor who is his primary gastroenterologist in [place].  The patient then had a consult by Dr. Vicente Males who had set the patient up for an upper endoscopy which I performed.  At the time of the upper endoscopy the patient was found to have findings consistent with gastric bypass surgery with a marginal ulcer seen.  The patient then underwent a colonoscopy by Dr. Vicente Males 4 days later on October 31.  The patient was promptly discharged after the colonoscopy and is now here for follow-up.  The patient denies any further bleeding.  He denies any abdominal pain unexplained weight loss fevers chills nausea or vomiting.  The patient's wife questions whether he can take Tylenol with Benadryl before going to sleep and continue to drink soft drinks.  He denies taking any anti-inflammatory medications.  He also has questions about drinking beer which he states he doesn't frequently.  Past Medical History:  Diagnosis Date   Allergy    Anemia    Arthritis    Cataract    Chronic kidney disease    Kidney stone   Clotting disorder ([place])    Deep vein blood clot of left lower extremity (HCC)    GERD (gastroesophageal reflux disease)    Gout    Hypertension    Melanoma ([place]) 2014   forehead   Sleep apnea    cpap - uses it nightly   Stroke ([place])    TIA    Current Outpatient Medications  Medication Sig Dispense Refill   allopurinol (ZYLOPRIM) 300 MG tablet Take 300 mg by mouth daily.     carvedilol (COREG) 12.5 MG tablet Take 12.5 mg by mouth 2 (two) times daily.     Cholecalciferol (VITAMIN D3) 5000 UNITS TABS Take 5,000 Units by mouth daily.      clopidogrel (PLAVIX) 75  MG tablet Take 75 mg by mouth daily.     Cranberry 200 MG CAPS      diphenhydramine-acetaminophen (TYLENOL PM) 25-500 MG TABS Take 1 tablet by mouth at bedtime as needed (sleep).      doxazosin (CARDURA) 8 MG tablet Take 8 mg by mouth at bedtime.      econazole nitrate 1 % cream      ferrous sulfate 325 (65 FE) MG EC tablet Take 1 tablet (325 mg total) by mouth every other day. 60 tablet 3   flintstones complete (FLINTSTONES) 60 MG chewable tablet Chew 1 tablet by mouth daily.      hydrALAZINE (APRESOLINE) 100 MG tablet Take 100 mg by mouth 2 (two) times daily.     hydrochlorothiazide (HYDRODIURIL) 25 MG tablet      hydrocortisone (ANUSOL-HC) 2.5 % rectal cream APPLY TOPICALLY TWICE DAILY FOR 10 DAYS     ketoconazole (NIZORAL) 2 % shampoo      omeprazole (PRILOSEC) 40 MG capsule Take 1 capsule (40 mg total) by mouth 2 (two) times daily.     OVER THE COUNTER MEDICATION Vicks Sinex Severe Nasal Decongestant, One drop in each nostril, 2 times daily.     sucralfate (CARAFATE) 1 GM/10ML suspension Take 64ml by mouth 4 times a day  3600 mL 1   telmisartan (MICARDIS) 80 MG tablet Take 80 mg by mouth daily.     traMADol (ULTRAM) 50 MG tablet Take 50 mg by mouth every 6 (six) hours as needed for pain.     vitamin B-12 (CYANOCOBALAMIN) 500 MCG tablet Take 500 mcg by mouth daily.     No current facility-administered medications for this visit.    Allergies as of 09/19/2021 - Review Complete 09/19/2021  Allergen Reaction Noted   Amlodipine Other (See Comments) 06/08/2015   Nsaids Other (See Comments) 06/08/2015   Tolmetin Other (See Comments) 06/08/2015    ROS:  General: Negative for anorexia, weight loss, fever, chills, fatigue, weakness. ENT: Negative for hoarseness, difficulty swallowing , nasal congestion. CV: Negative for chest pain, angina, palpitations, dyspnea on exertion, peripheral edema.  Respiratory: Negative for dyspnea at rest, dyspnea on exertion, cough, sputum, wheezing.  GI: See  history of present illness. GU:  Negative for dysuria, hematuria, urinary incontinence, urinary frequency, nocturnal urination.  Endo: Negative for unusual weight change.    Physical Examination:   BP (!) 175/91 (BP Location: Right Arm, Patient Position: Sitting, Cuff Size: Large)   Pulse (!) 54   Temp 98.4 F (36.9 C) (Temporal)   Ht 5\' 7"  (1.702 m)   Wt 273 lb 6.4 oz (124 kg)   BMI 42.82 kg/m   General: Well-nourished, well-developed in no acute distress.  Eyes: No icterus. Conjunctivae pink. Neuro: Alert and oriented x 3.  Grossly intact. Skin: Warm and dry, no jaundice.   Psych: Alert and cooperative, normal mood and affect.  Labs:    Imaging Studies: No results found.  Assessment and Plan:   Randall [name] is a 75 y.o. y/o male who comes for follow-up after being in the hospital with a GI bleed.  The patient has been doing well without any further sign of bleeding.  The patient has been told to avoid NSAIDs due to his gastric bypass.  He has also been told that he can take Tylenol and Benadryl for pains and to help him sleep. The patient has been told to contact me if he should have any further bleeding or if he has excessive bleeding he should go to the ER. The patient has been by the plan and agrees with it.     Lucilla Lame, MD. Marval Regal    Note: This dictation was prepared with Dragon dictation along with smaller phrase technology. Any transcriptional errors that result from this process are unintentional.

## 2021-10-17 ENCOUNTER — Other Ambulatory Visit: Payer: Self-pay | Admitting: Internal Medicine

## 2021-12-17 ENCOUNTER — Other Ambulatory Visit: Payer: Self-pay | Admitting: Internal Medicine

## 2022-01-23 ENCOUNTER — Other Ambulatory Visit: Payer: Self-pay | Admitting: Internal Medicine

## 2022-01-27 NOTE — Progress Notes (Signed)
? ? ?01/28/2022 ?Randall [name] ?[id number] ?28-Sep-1946 ? ? ?ASSESSMENT AND PLAN:  ? ?Chronic blood loss anemia ?Will repeat CBC to assure stability. 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 ?Patient has been on omeprazole 40 mg twice daily and Carafate 3-4 times daily. ?Ferritin still low, will repeat endoscopy to see if ulceration has healed.   ?May need capsule endoscopy if this is negative. ?If continuing anemia despite iron replacement therapy and work-ups are negative, consider follow-up with neurology to reevaluate need for Plavix therapy. ? ?Melena History of Anastomotic ulcer S/P gastric bypass ?Has not had any further episodes ?Repeat CBC today, continue PPI twice daily and Carafate. ? ?Morbid obesity ([place]) ?BMI 44 ? ?History of TIA (transient ischemic attack) ?Patient told to hold his Plavix for 5 days prior to time of procedure.   ?We will communicate with his prescribing physician Dr. Caryl Comes to ensure that holding his Plavix is acceptable for him.  ?We discussed the risk, benefits and alternatives to colonoscopy/endoscopy as well as the risk of him being off anticoagulation for the procedure and he is agreeable and wishes to proceed.  ? ? ? ?History of Present Illness:  ?76 y.o. male  with a past medical history of CKD, hypertension, sleep apnea on CPAP, history of TIA on Plavix, history of DVT, GERD on Prilosec 40 mg daily and Carafate suspension, anemia and others listed below, known to Dr. Henrene Pastor returns to clinic today for evaluation of dark stools and anemia. ? ?08/19/2021 colonoscopy Dr. Vicente Males For melena showed completely normal colon, no biopsies taken ?08/15/2021 upper endoscopy with Dr. Vicente Males  For melena showed normal esophagus, normal jejunum, gastric bypass with gastrojejunal anastomosis characterized by ulceration ?11/18/2019 upper endoscopy Follow-up for peptic ulcer disease showed patent anastomosis previously noted  ulcer healed performed by Dr. Henrene Pastor. ? 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  Had normal colonoscopy at that time with ulceration at gastrojejunal anastomosis site.  ?He is on prilosec 40 mg twice a day, he is on carafate 2-3 times a day.  ?He is on iron every other day x 1 year.  ?Denies NSAIDs. ?Most recently patient was seen at primary care clinic found to have IDA. 01/20/2022  ?He had 3 days of black stools prior to 01/06/2022 visit with his PCP, stools formed, 3-4 total, none since that time.  ?Decreased from 12.8 on 03/13 to  hemoglobin 11.1, MCV 86.4 on 01/20/2022 ?Iron was normal at 50, ferritin low at 14.  ?He has had fatigue, no chest pain, some SOB with coughing from pollen. He has had bilateral leg swelling, wears compression socks.  ?He has had no nausea, vomiting, AB pain.  ? ? ?Current Medications:  ? ? ?Current Outpatient Medications (Cardiovascular):  ?  carvedilol (COREG) 12.5 MG tablet, Take 12.5 mg by mouth 2 (two) times daily. ?  doxazosin (CARDURA) 8 MG tablet, Take 8 mg by mouth at bedtime.  ?  hydrALAZINE (APRESOLINE) 100 MG tablet, Take 100 mg by mouth 2 (two) times daily. ?  hydrochlorothiazide (HYDRODIURIL) 25 MG tablet,  ?  telmisartan (MICARDIS) 80 MG tablet, Take 80 mg by mouth daily. ? ? ?Current Outpatient Medications (Analgesics):  ?  allopurinol (ZYLOPRIM) 300 MG tablet, Take 300 mg by mouth daily. ?  traMADol (ULTRAM) 50 MG tablet, Take 50 mg by mouth every 6 (six) hours as needed for pain. ? ?Current Outpatient Medications (Hematological):  ?  clopidogrel (PLAVIX) 75 MG tablet, Take 75 mg by mouth daily. ?  ferrous sulfate 325 (65 FE) MG EC tablet, Take 1 tablet (325 mg total) by mouth every other day. ?  vitamin B-12 (CYANOCOBALAMIN) 500 MCG tablet, Take 500 mcg by mouth daily. ? ?Current Outpatient Medications (Other):  ?  Cholecalciferol (VITAMIN D3) 5000 UNITS TABS, Take 5,000 Units by  mouth daily.  ?  Cranberry 200 MG CAPS,  ?  diphenhydramine-acetaminophen (TYLENOL PM) 25-500 MG TABS, Take 1 tablet by mouth at bedtime as needed (sleep).  ?  econazole nitrate 1 % cream,  ?  flintstones complete (FLINTSTONES) 60 MG chewable tablet, Chew 1 tablet by mouth daily.  ?  ketoconazole (NIZORAL) 2 % shampoo,  ?  OVER THE COUNTER MEDICATION, Vicks Sinex Severe Nasal Decongestant, One drop in each nostril, 2 times daily. ?  sucralfate (CARAFATE) 1 GM/10ML suspension, TAKE 10ML BY MOUTH 4 TIMES A DAY ?  hydrocortisone (ANUSOL-HC) 2.5 % rectal cream, APPLY TOPICALLY TWICE DAILY FOR 10 DAYS ?  omeprazole (PRILOSEC) 40 MG capsule, Take 1 capsule (40 mg total) by mouth 2 (two) times daily. ? 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 ?Family History:  ?His family history includes Endometrial cancer in his mother; Prostate cancer in his father. ?Social History:  ? reports that he has never smoked. He has quit using smokeless tobacco.  His smokeless tobacco use included snuff. He reports current alcohol use. He reports that he does not use drugs. ? ?Current Medications, Allergies, Past Medical History, Past Surgical History, Family History and Social History were reviewed in [employer] record. ? ?Physical Exam: ?BP 120/80   Pulse (!) 54   Ht '5\' 7"'$  (1.702 m)   Wt 281 lb (127.5 kg)   BMI 44.01  kg/m?  ?General:   Pleasant, well developed male in no acute distress ?Heart:  Regular rate and rhythm; no murmurs ?Pulm: Clear anteriorly; no wheezing ?Abdomen:  Soft, Obese AB, Active bowel sounds. No tenderness . Without guarding and Without rebound, No organomegaly appreciated. ?Extremities:  with  edema. ?Neurologic:  Alert and  oriented x4;  No focal deficits.  ?Psych:  Cooperative. Normal mood and affect. ? ? ?Vladimir Crofts, PA-C ?01/28/22 ?

## 2022-01-28 ENCOUNTER — Telehealth: Payer: Self-pay | Admitting: *Deleted

## 2022-01-28 ENCOUNTER — Ambulatory Visit (INDEPENDENT_AMBULATORY_CARE_PROVIDER_SITE_OTHER): Payer: Medicare Other | Admitting: Physician Assistant

## 2022-01-28 ENCOUNTER — Encounter: Payer: Self-pay | Admitting: *Deleted

## 2022-01-28 ENCOUNTER — Telehealth: Payer: Self-pay

## 2022-01-28 ENCOUNTER — Other Ambulatory Visit (INDEPENDENT_AMBULATORY_CARE_PROVIDER_SITE_OTHER): Payer: Medicare Other

## 2022-01-28 VITALS — BP 120/80 | HR 54 | Ht 67.0 in | Wt 281.0 lb

## 2022-01-28 DIAGNOSIS — K921 Melena: Secondary | ICD-10-CM

## 2022-01-28 DIAGNOSIS — Z8673 Personal history of transient ischemic attack (TIA), and cerebral infarction without residual deficits: Secondary | ICD-10-CM | POA: Diagnosis not present

## 2022-01-28 DIAGNOSIS — D500 Iron deficiency anemia secondary to blood loss (chronic): Secondary | ICD-10-CM | POA: Diagnosis not present

## 2022-01-28 DIAGNOSIS — K289 Gastrojejunal ulcer, unspecified as acute or chronic, without hemorrhage or perforation: Secondary | ICD-10-CM

## 2022-01-28 LAB — CBC WITH DIFFERENTIAL/PLATELET
Basophils Absolute: 0 10*3/uL (ref 0.0–0.1)
Basophils Relative: 0.2 % (ref 0.0–3.0)
Eosinophils Absolute: 0.3 10*3/uL (ref 0.0–0.7)
Eosinophils Relative: 4.6 % (ref 0.0–5.0)
HCT: 34.5 % — ABNORMAL LOW (ref 39.0–52.0)
Hemoglobin: 11.5 g/dL — ABNORMAL LOW (ref 13.0–17.0)
Lymphocytes Relative: 17.2 % (ref 12.0–46.0)
Lymphs Abs: 1.2 10*3/uL (ref 0.7–4.0)
MCHC: 33.3 g/dL (ref 30.0–36.0)
MCV: 84.5 fl (ref 78.0–100.0)
Monocytes Absolute: 0.5 10*3/uL (ref 0.1–1.0)
Monocytes Relative: 7.7 % (ref 3.0–12.0)
Neutro Abs: 4.8 10*3/uL (ref 1.4–7.7)
Neutrophils Relative %: 70.3 % (ref 43.0–77.0)
Platelets: 244 10*3/uL (ref 150.0–400.0)
RBC: 4.09 Mil/uL — ABNORMAL LOW (ref 4.22–5.81)
RDW: 14.8 % (ref 11.5–15.5)
WBC: 6.8 10*3/uL (ref 4.0–10.5)

## 2022-01-28 NOTE — Progress Notes (Signed)
Reviewed. ?Estill Bamberg, ?1.  His refractory iron deficiency may be due to poor iron absorption secondary to gastric bypass ?2.  Agree with EGD to rule out recurrent marginal ulcer ?3.  He can STAY ON PLAVIX for the EGD.  Please make sure this information is communicated to the patient ?4.  I assume he wants his continuity GI care with [facility], despite recent care with [facility]? ?Thanks, ?Dr. Henrene Pastor ?

## 2022-01-28 NOTE — Telephone Encounter (Signed)
There is no record that this is a patient of [facility]. ? ?Please call with questions. ? ?Emmaline Life, NP-C ? ?  ?01/28/2022, 11:14 AM ?[facility] ?[address] ?Office [phone] Fax [phone] ? ?

## 2022-01-28 NOTE — Patient Instructions (Signed)
You have been scheduled for an endoscopy. Please follow written instructions given to you at your visit today. ?If you use inhalers (even only as needed), please bring them with you on the day of your procedure. ? ?Your provider has requested that you go to the basement level for lab work before leaving today. Press "B" on the elevator. The lab is located at the first door on the left as you exit the elevator. ? ?If you are age 75 or older, your body mass index should be between 23-30. Your Body mass index is 44.01 kg/m?Marland Kitchen If this is out of the aforementioned range listed, please consider follow up with your Primary Care Provider. ? ?If you are age 76 or younger, your body mass index should be between 19-25. Your Body mass index is 44.01 kg/m?Marland Kitchen If this is out of the aformentioned range listed, please consider follow up with your Primary Care Provider.  ? ?________________________________________________________ ? ?The [facility] providers would like to encourage you to use [facility] to communicate with providers for non-urgent requests or questions.  Due to long hold times on the telephone, sending your provider a message by [facility] may be a faster and more efficient way to get a response.  Please allow 48 business hours for a response.  Please remember that this is for non-urgent requests.  ?_______________________________________________________ ? ?Due to recent changes in healthcare laws, you may see the results of your imaging and laboratory studies on MyChart before your provider has had a chance to review them.  We understand that in some cases there may be results that are confusing or concerning to you. Not all laboratory results come back in the same time frame and the provider may be waiting for multiple results in order to interpret others.  Please give [place] 48 hours in order for your provider to thoroughly review all the results before contacting the office for clarification of your results.  ? ?

## 2022-01-28 NOTE — Telephone Encounter (Signed)
-----   Message from Vladimir Crofts, [place] sent at 01/28/2022  2:11 PM EDT ----- ?Per Dr. Henrene Pastor, patient can stay on the plavix for his EGD.  ?Thanks! ?Estill Bamberg ?----- Message ----- ?From: Irene Shipper, MD ?Sent: 01/28/2022   2:00 PM EDT ?To: Vladimir Crofts, PA-C ? ? ? ? ?----- Message ----- ?From: Vladimir Crofts, PA-C ?Sent: 01/28/2022   1:18 PM EDT ?To: Irene Shipper, MD ? ? ?

## 2022-01-28 NOTE — Telephone Encounter (Signed)
Spoke with patient's wife & let her know that patient can stay on plavix for procedure per Dr. Dominga Ferry, PA.  ?

## 2022-01-28 NOTE — Telephone Encounter (Signed)
Request for surgical clearance:     Endoscopy Procedure ? ?What type of surgery is being performed?     endoscopy ? ?When is this surgery scheduled?     02/13/22 ? ?What type of clearance is required ?   Pharmacy ? ?Are there any medications that need to be held prior to surgery and how long? Plavix, 5 days ? ?Practice name and name of physician performing surgery?      [facility] ? ?What is your office phone and fax number?      Phone- [phone]  Fax- [phone] ? ?Anesthesia type (None, local, MAC, general) ?       [facility] ? ?

## 2022-01-28 NOTE — Telephone Encounter (Signed)
See additional phone note dated 01/28/22. Dr Henrene Pastor indicates patient may continue plavix for procedure. ?

## 2022-01-28 NOTE — Progress Notes (Signed)
Randall Lambert, MD ?Yes, patient prefers to come here and continue care here.  ?Was just confused and poor communication from PCP/hospital.  ?Told my nurse to call and state he can stay on the plavix.  ?Thanks,  ?Randall [name]  ?

## 2022-02-13 ENCOUNTER — Encounter: Payer: Self-pay | Admitting: Internal Medicine

## 2022-02-13 ENCOUNTER — Ambulatory Visit (AMBULATORY_SURGERY_CENTER): Payer: Medicare Other | Admitting: Internal Medicine

## 2022-02-13 VITALS — BP 167/70 | HR 52 | Temp 98.4°F | Resp 16 | Ht 67.0 in | Wt 281.0 lb

## 2022-02-13 DIAGNOSIS — K259 Gastric ulcer, unspecified as acute or chronic, without hemorrhage or perforation: Secondary | ICD-10-CM

## 2022-02-13 DIAGNOSIS — K921 Melena: Secondary | ICD-10-CM

## 2022-02-13 DIAGNOSIS — T85898A Other specified complication of other internal prosthetic devices, implants and grafts, initial encounter: Secondary | ICD-10-CM

## 2022-02-13 DIAGNOSIS — D500 Iron deficiency anemia secondary to blood loss (chronic): Secondary | ICD-10-CM | POA: Diagnosis not present

## 2022-02-13 MED ORDER — SUCRALFATE 1 GM/10ML PO SUSP: ORAL | 2 refills | Status: DC

## 2022-02-13 MED ORDER — SODIUM CHLORIDE 0.9 % IV SOLN: 500.0000 mL | INTRAVENOUS | Status: DC

## 2022-02-13 MED ORDER — SUCRALFATE 1 GM/10ML PO SUSP: 1.0000 g | Freq: Two times a day (BID) | ORAL | 6 refills | Status: DC

## 2022-02-13 MED ORDER — OMEPRAZOLE 40 MG PO CPDR: 40.0000 mg | DELAYED_RELEASE_CAPSULE | Freq: Every day | ORAL | 3 refills | Status: DC

## 2022-02-13 MED ORDER — OMEPRAZOLE 40 MG PO CPDR: 40.0000 mg | DELAYED_RELEASE_CAPSULE | Freq: Two times a day (BID) | ORAL | 2 refills | Status: DC

## 2022-02-13 NOTE — Patient Instructions (Signed)
Discharge instructions given. ?Prescriptions sent to pharmacy. ?Resume previous medications. ?YOU HAD AN ENDOSCOPIC PROCEDURE TODAY AT [facility]:   Refer to the procedure report that was given to you for any specific questions about what was found during the examination.  If the procedure report does not answer your questions, please call your gastroenterologist to clarify.  If you requested that your care partner not be given the details of your procedure findings, then the procedure report has been included in a sealed envelope for you to review at your convenience later. ? ?YOU SHOULD EXPECT: Some feelings of bloating in the abdomen. Passage of more gas than usual.  Walking can help get rid of the air that was put into your GI tract during the procedure and reduce the bloating. If you had a lower endoscopy (such as a colonoscopy or flexible sigmoidoscopy) you may notice spotting of blood in your stool or on the toilet paper. If you underwent a bowel prep for your procedure, you may not have a normal bowel movement for a few days. ? ?Please Note:  You might notice some irritation and congestion in your nose or some drainage.  This is from the oxygen used during your procedure.  There is no need for concern and it should clear up in a day or so. ? ?SYMPTOMS TO REPORT IMMEDIATELY: ? ? ?Following upper endoscopy (EGD) ? Vomiting of blood or coffee ground material ? New chest pain or pain under the shoulder blades ? Painful or persistently difficult swallowing ? New shortness of breath ? Fever of 100?F or higher ? Black, tarry-looking stools ? ?For urgent or emergent issues, a gastroenterologist can be reached at any hour by calling [phone]. ?Do not use MyChart messaging for urgent concerns.  ? ? ?DIET:  We do recommend a small meal at first, but then you may proceed to your regular diet.  Drink plenty of fluids but you should avoid alcoholic beverages for 24 hours. ? ?ACTIVITY:  You should  plan to take it easy for the rest of today and you should NOT DRIVE or use heavy machinery until tomorrow (because of the sedation medicines used during the test).   ? ?FOLLOW UP: ?Our staff will call the number listed on your records 48-72 hours following your procedure to check on you and address any questions or concerns that you may have regarding the information given to you following your procedure. If we do not reach you, we will leave a message.  We will attempt to reach you two times.  During this call, we will ask if you have developed any symptoms of COVID 19. If you develop any symptoms (ie: fever, flu-like symptoms, shortness of breath, cough etc.) before then, please call [phone].  If you test positive for Covid 19 in the 2 weeks post procedure, please call and report this information to [place].   ? ?If any biopsies were taken you will be contacted by phone or by letter within the next 1-3 weeks.  Please call us at [phone] if you have not heard about the biopsies in 3 weeks.  ? ? ?SIGNATURES/CONFIDENTIALITY: ?You and/or your care partner have signed paperwork which will be entered into your electronic medical record.  These signatures attest to the fact that that the information above on your After Visit Summary has been reviewed and is understood.  Full responsibility of the confidentiality of this discharge information lies with you and/or your care-partner.  ?

## 2022-02-13 NOTE — Progress Notes (Signed)
Sedate, gd SR, tolerated procedure well, VSS, report to RN 

## 2022-02-13 NOTE — Op Note (Signed)
[name] ?Procedure Date: 02/13/2022 10:20 AM ?MRN: [id number] ?Endoscopist: Docia Chuck. Henrene Pastor , MD ?Age: 76 ?Referring MD:  ?Date of Birth: [birth year] ?Gender: Male ?Account #: [id number] ?Procedure:                Upper GI endoscopy ?Indications:              Iron deficiency anemia ?Medicines:                Monitored Anesthesia Care ?Procedure:                Pre-Anesthesia Assessment: ?                          - Prior to the procedure, a History and Physical  ?                          was performed, and patient medications and  ?                          allergies were reviewed. The patient's tolerance of  ?                          previous anesthesia was also reviewed. The risks  ?                          and benefits of the procedure and the sedation  ?                          options and risks were discussed with the patient.  ?                          All questions were answered, and informed consent  ?                          was obtained. Prior Anticoagulants: The patient has  ?                          taken Plavix (clopidogrel), last dose was 1 day  ?                          prior to procedure. ASA Grade Assessment: III - A  ?                          patient with severe systemic disease. After  ?                          reviewing the risks and benefits, the patient was  ?                          deemed in satisfactory condition to undergo the  ?                          procedure. ?  After obtaining informed consent, the endoscope was  ?                          passed under direct vision. Throughout the  ?                          procedure, the patient's blood pressure, pulse, and  ?                          oxygen saturations were monitored continuously. The  ?                          Endoscope was introduced through the mouth, and  ?                          advanced to the second part of duodenum. The upper  ?                           GI endoscopy was accomplished without difficulty.  ?                          The patient tolerated the procedure well. ?Scope In: ?Scope Out: ?Findings:                 The esophagus was normal. ?                          The stomach revealed evidence of prior Roux-en-Y  ?                          gastric bypass. The anastomosis was widely patent.  ?                          No ulceration. There was staining from iron therapy. ?                          The examined jejunum was normal. ?                          The gastric remnant was unremarkable. ?Complications:            No immediate complications. ?Estimated Blood Loss:     Estimated blood loss: none. ?Impression:               Status post Roux-en-Y gastric bypass anatomy  ?                          without abnormality. No ulceration. ?Recommendation:           - Patient has a contact number available for  ?                          emergencies. The signs and symptoms of potential  ?                          delayed complications were discussed with the  ?  patient. Return to normal activities tomorrow.  ?                          Written discharge instructions were provided to the  ?                          patient. ?                          - Resume previous diet. ?                          - Continue present medications. Including Plavix ?                          ??? Refill omeprazole and Carafate for 1 year ?                          ??? Refill iron for 1 year ?                          ??? Return to the care of your primary provider ?Docia Chuck. Henrene Pastor, MD ?02/13/2022 10:43:09 AM ?This report has been signed electronically. ?

## 2022-02-17 ENCOUNTER — Telehealth: Payer: Self-pay

## 2022-02-17 NOTE — Telephone Encounter (Signed)
?  Follow up Call- ? ? ?  02/13/2022  ?  9:19 AM 11/18/2019  ? 11:15 AM 09/08/2019  ?  9:22 AM 07/29/2019  ?  2:06 PM  ?Call back number  ?Post procedure Call Back phone  # [phone]  ?Permission to leave phone message Yes Yes Yes Yes  ?  ? ?Patient questions: ? ?Do you have a fever, pain , or abdominal swelling? No. ?Pain Score  0 * ? ?Have you tolerated food without any problems? Yes.   ? ?Have you been able to return to your normal activities? Yes.   ? ?Do you have any questions about your discharge instructions: ?Diet   No. ?Medications  Yes.  - Wife wanted to confirm that there was a prescription for [facility]ate [facility] to [facility]. Verified with pts wife that there was an order sent to [facility] for that medication.  ?Follow up visit  No. ? ?Do you have questions or concerns about your Care? No. ? ?Actions: ?* If pain score is 4 or above: ?No action needed, pain <4. ? ? ?

## 2022-08-03 ENCOUNTER — Other Ambulatory Visit: Payer: Self-pay | Admitting: Internal Medicine

## 2022-08-03 DIAGNOSIS — K259 Gastric ulcer, unspecified as acute or chronic, without hemorrhage or perforation: Secondary | ICD-10-CM

## 2022-08-14 ENCOUNTER — Other Ambulatory Visit: Payer: Self-pay | Admitting: Internal Medicine

## 2022-08-14 DIAGNOSIS — K259 Gastric ulcer, unspecified as acute or chronic, without hemorrhage or perforation: Secondary | ICD-10-CM

## 2023-01-27 ENCOUNTER — Telehealth: Payer: Self-pay | Admitting: Pharmacy Technician

## 2023-01-27 ENCOUNTER — Other Ambulatory Visit (HOSPITAL_COMMUNITY): Payer: Self-pay

## 2023-01-27 NOTE — Telephone Encounter (Signed)
Patient Advocate Encounter  Received notification from [facility] that prior authorization for [facility] 1GM is required.   PA submitted on 4.9.24 Key [id number] Status is pending

## 2023-01-27 NOTE — Telephone Encounter (Signed)
Patient Advocate Encounter  Prior Authorization for SUCRALFATE 1GM SUSP has been approved.    PA# --- Effective dates: 1.10.24 through 4.9.25  Per test claim with [facility], copay for 60 days supply is $15

## 2023-09-14 ENCOUNTER — Other Ambulatory Visit: Payer: Self-pay

## 2023-09-14 DIAGNOSIS — N400 Enlarged prostate without lower urinary tract symptoms: Secondary | ICD-10-CM

## 2023-09-15 ENCOUNTER — Other Ambulatory Visit
Admission: RE | Admit: 2023-09-15 | Discharge: 2023-09-15 | Disposition: A | Discharge status: Home / Self Care | Payer: Medicare Other | Attending: Urology | Admitting: Urology

## 2023-09-15 ENCOUNTER — Ambulatory Visit: Payer: Medicare Other | Admitting: Urology

## 2023-09-15 ENCOUNTER — Encounter: Payer: Self-pay | Admitting: Urology

## 2023-09-15 VITALS — BP 156/88 | HR 71 | Ht 69.0 in | Wt 289.0 lb

## 2023-09-15 DIAGNOSIS — N3941 Urge incontinence: Secondary | ICD-10-CM | POA: Diagnosis not present

## 2023-09-15 DIAGNOSIS — N400 Enlarged prostate without lower urinary tract symptoms: Secondary | ICD-10-CM | POA: Diagnosis present

## 2023-09-15 DIAGNOSIS — N3943 Post-void dribbling: Secondary | ICD-10-CM

## 2023-09-15 DIAGNOSIS — R399 Unspecified symptoms and signs involving the genitourinary system: Secondary | ICD-10-CM | POA: Diagnosis not present

## 2023-09-15 LAB — BLADDER SCAN AMB NON-IMAGING

## 2023-09-15 LAB — URINALYSIS, COMPLETE (UACMP) WITH MICROSCOPIC
Bilirubin Urine: NEGATIVE
Glucose, UA: NEGATIVE mg/dL
Hgb urine dipstick: NEGATIVE
Ketones, ur: NEGATIVE mg/dL
Nitrite: NEGATIVE
Protein, ur: NEGATIVE mg/dL
Specific Gravity, Urine: 1.02 (ref 1.005–1.030)
pH: 5.5 (ref 5.0–8.0)

## 2023-09-15 NOTE — Patient Instructions (Signed)
Avoid sodas and diet drinks, these can make your urinary symptoms worse.  We will contact you with your urine culture results.  Your urinary symptoms are most likely secondary to your weight, and losing weight will likely improve your urinary symptoms more than any additional medications.

## 2023-09-15 NOTE — Progress Notes (Signed)
09/15/23 10:36 AM   Randall [name] [birth year] [id number]  CC: Urinary symptoms, urinary dribbling  HPI: 77 year old male with a number of comorbidities including morbid obesity with BMI of 43, sleep apnea on CPAP, stroke, DVT who presents with urinary symptoms.  He was reportedly previously diagnosed with UTI, but none of those culture results are available to me as this was reportedly at an urgent care out of town.  He is not sure that the antibiotics he was prescribed made any improvement.  His primary urinary complaints are urgency, urge incontinence, and dribbling urination.  He denies any gross hematuria or dysuria.  He drinks a fair amount of soda during the day.  He is already on doxazosin at bedtime.  Urinalysis today 0-5 WBC, 0-5 RBC, many bacteria, small leukocytes.  Sent for culture.  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)    Gastric ulcer    GERD (gastroesophageal reflux disease)    Gout    Hyperlipidemia    Hypertension    Melanoma (HCC) 2014   forehead   Sleep apnea    cpap - uses it nightly   Spinal stenosis    Stroke [facility])    TIA   Tubular adenoma of colon     Surgical History: Past Surgical History:  Procedure Laterality Date   BACK SURGERY  2005   spinal stenosis   barett's esophagus  2014   CARPAL TUNNEL RELEASE Left 10/30/2017   COLONOSCOPY  07/29/2019   [facility]   COLONOSCOPY N/A 08/19/2021   Procedure: COLONOSCOPY;  Surgeon: Wyline Mood, MD;  Location: [facility] ENDOSCOPY;  Service: Gastroenterology;  Laterality: N/A;   ESOPHAGOGASTRODUODENOSCOPY (EGD) WITH PROPOFOL N/A 08/15/2021   Procedure: ESOPHAGOGASTRODUODENOSCOPY (EGD) WITH PROPOFOL;  Surgeon: Midge Minium, MD;  Location: [facility] ENDOSCOPY;  Service: Endoscopy;  Laterality: N/A;   Eye  lid surgery Bilateral 11/15/2013   GASTRIC BYPASS  2014   POLYPECTOMY     removal melanoma  02/27/2014   forehead   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 He has been exposed to tobacco smoke. He has quit using smokeless tobacco.  His smokeless tobacco use included snuff. He reports current alcohol use. He reports that he does not use drugs.  Physical Exam: BP (!) 156/88   Pulse 71   Ht 5\' 9"  (1.753 m)   Wt 289 lb (131.1 kg)   BMI 42.68 kg/m    Constitutional:  Alert and oriented, No acute distress.  Morbidly obese Cardiovascular: No clubbing, cyanosis, or edema. Respiratory: Normal  respiratory effort, no increased work of breathing. 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  Urinalysis today relatively benign but will send for culture.  I did very frank conversation with the patient that I think his symptoms are more likely related to his morbid obesity and buried penis.  I think weight loss and avoiding bladder irritants would certainly be beneficial for him.  I do not think this is a BPH or prostate issue.  Could  consider an OAB medication in the future, but would avoid anticholinergics with his age and frailty.  He also does not move very well secondary to his size and back pain, and his incontinence likely has a component of inability to get to the restroom in time based on his poor mobility.  Not a candidate for PSA screening based on his age and frailty.  -Behavioral strategies discussed extensively, reassurance provided regarding normal bladder emptying -Would likely benefit significantly from weight loss and avoiding bladder irritants -Follow-up urine culture, if positive could consider a 2-week course of antibiotics   Randall Rams, MD 09/15/2023  [facility] [address] [place], [place] [address] [phone]

## 2023-09-17 LAB — URINE CULTURE: Culture: >=100000 — AB

## 2023-09-21 ENCOUNTER — Telehealth: Payer: Self-pay

## 2023-09-21 ENCOUNTER — Telehealth: Payer: Self-pay | Admitting: Urology

## 2023-09-21 DIAGNOSIS — R3989 Other symptoms and signs involving the genitourinary system: Secondary | ICD-10-CM

## 2023-09-21 LAB — MISC LABCORP TEST (SEND OUT): Labcorp test code: 86884

## 2023-09-21 MED ORDER — SULFAMETHOXAZOLE-TRIMETHOPRIM 800-160 MG PO TABS: 1.0000 | ORAL_TABLET | Freq: Two times a day (BID) | ORAL | 0 refills | Status: AC

## 2023-09-21 NOTE — Telephone Encounter (Signed)
Pt informed as he walked into clinic. RX sent in.

## 2023-09-21 NOTE — Telephone Encounter (Signed)
See result note.  

## 2023-09-21 NOTE — Telephone Encounter (Signed)
Pt LMOM asking about results from u/a, culture done on 11/26.  I saw message from Jacobus put in today.

## 2023-09-21 NOTE — Telephone Encounter (Signed)
-----   Message from Sondra Come sent at 09/21/2023  7:28 AM EST ----- Urine culture did show bacteria, I would recommend a 2-week course of Bactrim DS twice daily to see if this improves his urinary symptoms  Legrand Rams, MD 09/21/2023

## 2023-10-13 ENCOUNTER — Other Ambulatory Visit: Payer: Self-pay | Admitting: Internal Medicine

## 2023-10-13 DIAGNOSIS — K259 Gastric ulcer, unspecified as acute or chronic, without hemorrhage or perforation: Secondary | ICD-10-CM

## 2023-10-26 ENCOUNTER — Other Ambulatory Visit: Payer: Self-pay | Admitting: Internal Medicine

## 2023-10-26 DIAGNOSIS — K259 Gastric ulcer, unspecified as acute or chronic, without hemorrhage or perforation: Secondary | ICD-10-CM

## 2023-11-17 ENCOUNTER — Other Ambulatory Visit: Payer: Self-pay | Admitting: Internal Medicine

## 2023-11-17 DIAGNOSIS — K259 Gastric ulcer, unspecified as acute or chronic, without hemorrhage or perforation: Secondary | ICD-10-CM

## 2024-01-23 ENCOUNTER — Other Ambulatory Visit: Payer: Self-pay | Admitting: Internal Medicine

## 2024-01-23 DIAGNOSIS — K259 Gastric ulcer, unspecified as acute or chronic, without hemorrhage or perforation: Secondary | ICD-10-CM

## 2024-05-06 ENCOUNTER — Telehealth: Payer: Self-pay

## 2024-05-06 ENCOUNTER — Other Ambulatory Visit: Payer: Self-pay | Admitting: Internal Medicine

## 2024-05-06 DIAGNOSIS — K259 Gastric ulcer, unspecified as acute or chronic, without hemorrhage or perforation: Secondary | ICD-10-CM

## 2024-05-06 NOTE — Telephone Encounter (Signed)
 Pharmacy Patient Advocate Encounter   Received notification from Cover[employer]eds that prior authorization for Sucralfate  1GM/10ML suspension is required/requested.   Insurance verification completed.   The patient is insured through [facility] .   Per test claim: Patient has not been seen since 2023. Office visit required for submission.

## 2024-05-06 NOTE — Telephone Encounter (Signed)
 2 notes have been made to patient that he needs appointment for refills of medications already.
